# Patient Record
Sex: Male | Born: 1950 | Race: White | Hispanic: No | Marital: Single | Smoking: Never smoker
Health system: Southern US, Community
[De-identification: ages and names within clinical notes are randomized; demographics above are authoritative.]

## PROBLEM LIST (undated history)

## (undated) DIAGNOSIS — M1990 Unspecified osteoarthritis, unspecified site: Secondary | ICD-10-CM

## (undated) DIAGNOSIS — R600 Localized edema: Secondary | ICD-10-CM

## (undated) DIAGNOSIS — C61 Malignant neoplasm of prostate: Secondary | ICD-10-CM

## (undated) DIAGNOSIS — K5790 Diverticulosis of intestine, part unspecified, without perforation or abscess without bleeding: Secondary | ICD-10-CM

## (undated) DIAGNOSIS — E7800 Pure hypercholesterolemia, unspecified: Secondary | ICD-10-CM

## (undated) DIAGNOSIS — H269 Unspecified cataract: Secondary | ICD-10-CM

## (undated) DIAGNOSIS — I10 Essential (primary) hypertension: Secondary | ICD-10-CM

## (undated) DIAGNOSIS — R0600 Dyspnea, unspecified: Secondary | ICD-10-CM

## (undated) HISTORY — DX: Pure hypercholesterolemia, unspecified: E78.00

## (undated) HISTORY — DX: Diverticulosis of intestine, part unspecified, without perforation or abscess without bleeding: K57.90

## (undated) HISTORY — DX: Gilbert syndrome: E80.4

## (undated) HISTORY — PX: SHOULDER SURGERY: SHX246

## (undated) HISTORY — PX: PROSTATE BIOPSY: SHX241

---
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
## 2012-04-24 HISTORY — PX: TRANSURETHRAL RESECTION OF PROSTATE: SHX73

## 2013-04-24 HISTORY — PX: CATARACT EXTRACTION, BILATERAL: SHX1313

## 2013-05-20 DIAGNOSIS — K625 Hemorrhage of anus and rectum: Secondary | ICD-10-CM | POA: Diagnosis not present

## 2013-05-21 DIAGNOSIS — D126 Benign neoplasm of colon, unspecified: Secondary | ICD-10-CM | POA: Diagnosis not present

## 2013-05-21 DIAGNOSIS — Z7982 Long term (current) use of aspirin: Secondary | ICD-10-CM | POA: Diagnosis not present

## 2013-05-21 DIAGNOSIS — K625 Hemorrhage of anus and rectum: Secondary | ICD-10-CM | POA: Diagnosis not present

## 2013-05-21 DIAGNOSIS — I10 Essential (primary) hypertension: Secondary | ICD-10-CM | POA: Diagnosis not present

## 2013-05-21 DIAGNOSIS — K5730 Diverticulosis of large intestine without perforation or abscess without bleeding: Secondary | ICD-10-CM | POA: Diagnosis not present

## 2013-05-21 DIAGNOSIS — Z79899 Other long term (current) drug therapy: Secondary | ICD-10-CM | POA: Diagnosis not present

## 2013-05-21 DIAGNOSIS — K648 Other hemorrhoids: Secondary | ICD-10-CM | POA: Diagnosis not present

## 2013-05-21 DIAGNOSIS — E7800 Pure hypercholesterolemia, unspecified: Secondary | ICD-10-CM | POA: Diagnosis not present

## 2013-05-21 HISTORY — PX: COLONOSCOPY: SHX174

## 2013-06-02 DIAGNOSIS — C44319 Basal cell carcinoma of skin of other parts of face: Secondary | ICD-10-CM | POA: Diagnosis not present

## 2013-06-02 DIAGNOSIS — L719 Rosacea, unspecified: Secondary | ICD-10-CM | POA: Diagnosis not present

## 2013-06-02 DIAGNOSIS — D1739 Benign lipomatous neoplasm of skin and subcutaneous tissue of other sites: Secondary | ICD-10-CM | POA: Diagnosis not present

## 2013-06-02 DIAGNOSIS — D2370 Other benign neoplasm of skin of unspecified lower limb, including hip: Secondary | ICD-10-CM | POA: Diagnosis not present

## 2013-06-02 DIAGNOSIS — L820 Inflamed seborrheic keratosis: Secondary | ICD-10-CM | POA: Diagnosis not present

## 2013-07-07 DIAGNOSIS — IMO0002 Reserved for concepts with insufficient information to code with codable children: Secondary | ICD-10-CM | POA: Diagnosis not present

## 2013-07-11 DIAGNOSIS — M5137 Other intervertebral disc degeneration, lumbosacral region: Secondary | ICD-10-CM | POA: Diagnosis not present

## 2013-07-11 DIAGNOSIS — M48061 Spinal stenosis, lumbar region without neurogenic claudication: Secondary | ICD-10-CM | POA: Diagnosis not present

## 2013-07-11 DIAGNOSIS — M47817 Spondylosis without myelopathy or radiculopathy, lumbosacral region: Secondary | ICD-10-CM | POA: Diagnosis not present

## 2013-07-14 DIAGNOSIS — H25019 Cortical age-related cataract, unspecified eye: Secondary | ICD-10-CM | POA: Diagnosis not present

## 2013-07-14 DIAGNOSIS — H2510 Age-related nuclear cataract, unspecified eye: Secondary | ICD-10-CM | POA: Diagnosis not present

## 2013-07-14 DIAGNOSIS — H25049 Posterior subcapsular polar age-related cataract, unspecified eye: Secondary | ICD-10-CM | POA: Diagnosis not present

## 2013-07-14 DIAGNOSIS — H18419 Arcus senilis, unspecified eye: Secondary | ICD-10-CM | POA: Diagnosis not present

## 2013-07-16 DIAGNOSIS — IMO0002 Reserved for concepts with insufficient information to code with codable children: Secondary | ICD-10-CM | POA: Diagnosis not present

## 2013-07-24 DIAGNOSIS — M5137 Other intervertebral disc degeneration, lumbosacral region: Secondary | ICD-10-CM | POA: Diagnosis not present

## 2013-07-29 DIAGNOSIS — IMO0002 Reserved for concepts with insufficient information to code with codable children: Secondary | ICD-10-CM | POA: Diagnosis not present

## 2013-07-29 DIAGNOSIS — G541 Lumbosacral plexus disorders: Secondary | ICD-10-CM | POA: Diagnosis not present

## 2013-07-31 DIAGNOSIS — G541 Lumbosacral plexus disorders: Secondary | ICD-10-CM | POA: Diagnosis not present

## 2013-07-31 DIAGNOSIS — M1610 Unilateral primary osteoarthritis, unspecified hip: Secondary | ICD-10-CM | POA: Diagnosis not present

## 2013-07-31 DIAGNOSIS — M169 Osteoarthritis of hip, unspecified: Secondary | ICD-10-CM | POA: Diagnosis not present

## 2013-07-31 DIAGNOSIS — IMO0002 Reserved for concepts with insufficient information to code with codable children: Secondary | ICD-10-CM | POA: Diagnosis not present

## 2013-08-07 DIAGNOSIS — G541 Lumbosacral plexus disorders: Secondary | ICD-10-CM | POA: Diagnosis not present

## 2013-08-07 DIAGNOSIS — IMO0002 Reserved for concepts with insufficient information to code with codable children: Secondary | ICD-10-CM | POA: Diagnosis not present

## 2013-08-12 DIAGNOSIS — H269 Unspecified cataract: Secondary | ICD-10-CM | POA: Diagnosis not present

## 2013-08-12 DIAGNOSIS — H2510 Age-related nuclear cataract, unspecified eye: Secondary | ICD-10-CM | POA: Diagnosis not present

## 2013-08-20 DIAGNOSIS — H2510 Age-related nuclear cataract, unspecified eye: Secondary | ICD-10-CM | POA: Diagnosis not present

## 2013-08-20 DIAGNOSIS — H269 Unspecified cataract: Secondary | ICD-10-CM | POA: Diagnosis not present

## 2013-09-01 DIAGNOSIS — L719 Rosacea, unspecified: Secondary | ICD-10-CM | POA: Diagnosis not present

## 2013-09-05 DIAGNOSIS — R5381 Other malaise: Secondary | ICD-10-CM | POA: Diagnosis not present

## 2013-09-05 DIAGNOSIS — R5383 Other fatigue: Secondary | ICD-10-CM | POA: Diagnosis not present

## 2013-10-08 DIAGNOSIS — R29898 Other symptoms and signs involving the musculoskeletal system: Secondary | ICD-10-CM | POA: Diagnosis not present

## 2013-10-27 DIAGNOSIS — I10 Essential (primary) hypertension: Secondary | ICD-10-CM | POA: Diagnosis not present

## 2013-11-03 DIAGNOSIS — E782 Mixed hyperlipidemia: Secondary | ICD-10-CM | POA: Diagnosis not present

## 2013-11-04 DIAGNOSIS — H43819 Vitreous degeneration, unspecified eye: Secondary | ICD-10-CM | POA: Diagnosis not present

## 2013-12-15 DIAGNOSIS — H04129 Dry eye syndrome of unspecified lacrimal gland: Secondary | ICD-10-CM | POA: Diagnosis not present

## 2013-12-15 DIAGNOSIS — H43819 Vitreous degeneration, unspecified eye: Secondary | ICD-10-CM | POA: Diagnosis not present

## 2014-01-28 DIAGNOSIS — Z23 Encounter for immunization: Secondary | ICD-10-CM | POA: Diagnosis not present

## 2014-03-05 DIAGNOSIS — M79673 Pain in unspecified foot: Secondary | ICD-10-CM | POA: Diagnosis not present

## 2014-03-05 DIAGNOSIS — M19072 Primary osteoarthritis, left ankle and foot: Secondary | ICD-10-CM | POA: Diagnosis not present

## 2014-03-05 DIAGNOSIS — M79672 Pain in left foot: Secondary | ICD-10-CM | POA: Diagnosis not present

## 2014-03-25 DIAGNOSIS — H04123 Dry eye syndrome of bilateral lacrimal glands: Secondary | ICD-10-CM | POA: Diagnosis not present

## 2014-06-11 DIAGNOSIS — Z0000 Encounter for general adult medical examination without abnormal findings: Secondary | ICD-10-CM | POA: Diagnosis not present

## 2014-06-11 DIAGNOSIS — Z1389 Encounter for screening for other disorder: Secondary | ICD-10-CM | POA: Diagnosis not present

## 2014-06-11 DIAGNOSIS — Z79899 Other long term (current) drug therapy: Secondary | ICD-10-CM | POA: Diagnosis not present

## 2014-06-11 DIAGNOSIS — Z125 Encounter for screening for malignant neoplasm of prostate: Secondary | ICD-10-CM | POA: Diagnosis not present

## 2014-06-11 DIAGNOSIS — Z9181 History of falling: Secondary | ICD-10-CM | POA: Diagnosis not present

## 2014-06-11 DIAGNOSIS — Z6839 Body mass index (BMI) 39.0-39.9, adult: Secondary | ICD-10-CM | POA: Diagnosis not present

## 2014-10-07 DIAGNOSIS — H04123 Dry eye syndrome of bilateral lacrimal glands: Secondary | ICD-10-CM | POA: Diagnosis not present

## 2015-02-19 DIAGNOSIS — Z23 Encounter for immunization: Secondary | ICD-10-CM | POA: Diagnosis not present

## 2015-04-11 DIAGNOSIS — H1033 Unspecified acute conjunctivitis, bilateral: Secondary | ICD-10-CM | POA: Diagnosis not present

## 2015-04-25 HISTORY — PX: KNEE ARTHROSCOPY: SUR90

## 2015-05-18 DIAGNOSIS — E669 Obesity, unspecified: Secondary | ICD-10-CM | POA: Diagnosis not present

## 2015-05-18 DIAGNOSIS — Z6838 Body mass index (BMI) 38.0-38.9, adult: Secondary | ICD-10-CM | POA: Diagnosis not present

## 2015-05-18 DIAGNOSIS — I10 Essential (primary) hypertension: Secondary | ICD-10-CM | POA: Diagnosis not present

## 2015-05-18 DIAGNOSIS — K219 Gastro-esophageal reflux disease without esophagitis: Secondary | ICD-10-CM | POA: Diagnosis not present

## 2015-07-12 DIAGNOSIS — H4302 Vitreous prolapse, left eye: Secondary | ICD-10-CM | POA: Diagnosis not present

## 2015-08-27 DIAGNOSIS — M1712 Unilateral primary osteoarthritis, left knee: Secondary | ICD-10-CM | POA: Diagnosis not present

## 2015-08-27 DIAGNOSIS — M25562 Pain in left knee: Secondary | ICD-10-CM | POA: Diagnosis not present

## 2015-09-15 DIAGNOSIS — Z6838 Body mass index (BMI) 38.0-38.9, adult: Secondary | ICD-10-CM | POA: Diagnosis not present

## 2015-09-15 DIAGNOSIS — Z79899 Other long term (current) drug therapy: Secondary | ICD-10-CM | POA: Diagnosis not present

## 2015-09-15 DIAGNOSIS — Z125 Encounter for screening for malignant neoplasm of prostate: Secondary | ICD-10-CM | POA: Diagnosis not present

## 2015-09-15 DIAGNOSIS — I10 Essential (primary) hypertension: Secondary | ICD-10-CM | POA: Diagnosis not present

## 2015-09-15 DIAGNOSIS — N400 Enlarged prostate without lower urinary tract symptoms: Secondary | ICD-10-CM | POA: Diagnosis not present

## 2015-09-15 DIAGNOSIS — K219 Gastro-esophageal reflux disease without esophagitis: Secondary | ICD-10-CM | POA: Diagnosis not present

## 2015-09-24 DIAGNOSIS — M1712 Unilateral primary osteoarthritis, left knee: Secondary | ICD-10-CM | POA: Diagnosis not present

## 2015-11-18 DIAGNOSIS — H26491 Other secondary cataract, right eye: Secondary | ICD-10-CM | POA: Diagnosis not present

## 2015-11-18 DIAGNOSIS — H04123 Dry eye syndrome of bilateral lacrimal glands: Secondary | ICD-10-CM | POA: Diagnosis not present

## 2015-11-18 DIAGNOSIS — H43812 Vitreous degeneration, left eye: Secondary | ICD-10-CM | POA: Diagnosis not present

## 2015-12-09 DIAGNOSIS — H26492 Other secondary cataract, left eye: Secondary | ICD-10-CM | POA: Diagnosis not present

## 2015-12-09 DIAGNOSIS — Z961 Presence of intraocular lens: Secondary | ICD-10-CM | POA: Diagnosis not present

## 2015-12-09 DIAGNOSIS — I10 Essential (primary) hypertension: Secondary | ICD-10-CM | POA: Diagnosis not present

## 2015-12-09 DIAGNOSIS — H26491 Other secondary cataract, right eye: Secondary | ICD-10-CM | POA: Diagnosis not present

## 2015-12-17 DIAGNOSIS — M1712 Unilateral primary osteoarthritis, left knee: Secondary | ICD-10-CM | POA: Diagnosis not present

## 2015-12-22 DIAGNOSIS — X58XXXA Exposure to other specified factors, initial encounter: Secondary | ICD-10-CM | POA: Diagnosis not present

## 2015-12-22 DIAGNOSIS — M25562 Pain in left knee: Secondary | ICD-10-CM | POA: Diagnosis not present

## 2015-12-22 DIAGNOSIS — M1712 Unilateral primary osteoarthritis, left knee: Secondary | ICD-10-CM | POA: Diagnosis not present

## 2015-12-22 DIAGNOSIS — S83242A Other tear of medial meniscus, current injury, left knee, initial encounter: Secondary | ICD-10-CM | POA: Diagnosis not present

## 2015-12-24 DIAGNOSIS — M1712 Unilateral primary osteoarthritis, left knee: Secondary | ICD-10-CM | POA: Diagnosis not present

## 2015-12-24 DIAGNOSIS — S83242A Other tear of medial meniscus, current injury, left knee, initial encounter: Secondary | ICD-10-CM | POA: Diagnosis not present

## 2016-01-18 DIAGNOSIS — Z79899 Other long term (current) drug therapy: Secondary | ICD-10-CM | POA: Diagnosis not present

## 2016-01-18 DIAGNOSIS — Z9181 History of falling: Secondary | ICD-10-CM | POA: Diagnosis not present

## 2016-01-18 DIAGNOSIS — K219 Gastro-esophageal reflux disease without esophagitis: Secondary | ICD-10-CM | POA: Diagnosis not present

## 2016-01-18 DIAGNOSIS — N400 Enlarged prostate without lower urinary tract symptoms: Secondary | ICD-10-CM | POA: Diagnosis not present

## 2016-01-18 DIAGNOSIS — I10 Essential (primary) hypertension: Secondary | ICD-10-CM | POA: Diagnosis not present

## 2016-01-18 DIAGNOSIS — Z6838 Body mass index (BMI) 38.0-38.9, adult: Secondary | ICD-10-CM | POA: Diagnosis not present

## 2016-01-18 DIAGNOSIS — Z23 Encounter for immunization: Secondary | ICD-10-CM | POA: Diagnosis not present

## 2016-02-18 DIAGNOSIS — S83242D Other tear of medial meniscus, current injury, left knee, subsequent encounter: Secondary | ICD-10-CM | POA: Diagnosis not present

## 2016-02-18 DIAGNOSIS — M1712 Unilateral primary osteoarthritis, left knee: Secondary | ICD-10-CM | POA: Diagnosis not present

## 2016-04-03 DIAGNOSIS — M25562 Pain in left knee: Secondary | ICD-10-CM | POA: Diagnosis not present

## 2016-04-03 DIAGNOSIS — S83242A Other tear of medial meniscus, current injury, left knee, initial encounter: Secondary | ICD-10-CM | POA: Diagnosis not present

## 2016-04-03 DIAGNOSIS — M1712 Unilateral primary osteoarthritis, left knee: Secondary | ICD-10-CM | POA: Diagnosis not present

## 2016-04-06 DIAGNOSIS — M659 Synovitis and tenosynovitis, unspecified: Secondary | ICD-10-CM | POA: Diagnosis not present

## 2016-04-06 DIAGNOSIS — E669 Obesity, unspecified: Secondary | ICD-10-CM | POA: Diagnosis not present

## 2016-04-06 DIAGNOSIS — I10 Essential (primary) hypertension: Secondary | ICD-10-CM | POA: Diagnosis not present

## 2016-04-06 DIAGNOSIS — M23222 Derangement of posterior horn of medial meniscus due to old tear or injury, left knee: Secondary | ICD-10-CM | POA: Diagnosis not present

## 2016-04-06 DIAGNOSIS — Z79899 Other long term (current) drug therapy: Secondary | ICD-10-CM | POA: Diagnosis not present

## 2016-04-06 DIAGNOSIS — Z79891 Long term (current) use of opiate analgesic: Secondary | ICD-10-CM | POA: Diagnosis not present

## 2016-04-06 DIAGNOSIS — M2242 Chondromalacia patellae, left knee: Secondary | ICD-10-CM | POA: Diagnosis not present

## 2016-04-06 DIAGNOSIS — Z7982 Long term (current) use of aspirin: Secondary | ICD-10-CM | POA: Diagnosis not present

## 2016-04-06 DIAGNOSIS — S83242A Other tear of medial meniscus, current injury, left knee, initial encounter: Secondary | ICD-10-CM | POA: Diagnosis not present

## 2016-04-06 DIAGNOSIS — M94262 Chondromalacia, left knee: Secondary | ICD-10-CM | POA: Diagnosis not present

## 2016-04-06 DIAGNOSIS — M1712 Unilateral primary osteoarthritis, left knee: Secondary | ICD-10-CM | POA: Diagnosis not present

## 2016-04-06 DIAGNOSIS — N400 Enlarged prostate without lower urinary tract symptoms: Secondary | ICD-10-CM | POA: Diagnosis not present

## 2016-04-06 DIAGNOSIS — M6752 Plica syndrome, left knee: Secondary | ICD-10-CM | POA: Diagnosis not present

## 2016-04-10 DIAGNOSIS — M25562 Pain in left knee: Secondary | ICD-10-CM | POA: Diagnosis not present

## 2016-04-10 DIAGNOSIS — R2689 Other abnormalities of gait and mobility: Secondary | ICD-10-CM | POA: Diagnosis not present

## 2016-04-10 DIAGNOSIS — M25662 Stiffness of left knee, not elsewhere classified: Secondary | ICD-10-CM | POA: Diagnosis not present

## 2016-04-14 DIAGNOSIS — M25662 Stiffness of left knee, not elsewhere classified: Secondary | ICD-10-CM | POA: Diagnosis not present

## 2016-04-14 DIAGNOSIS — R2689 Other abnormalities of gait and mobility: Secondary | ICD-10-CM | POA: Diagnosis not present

## 2016-04-14 DIAGNOSIS — M25562 Pain in left knee: Secondary | ICD-10-CM | POA: Diagnosis not present

## 2016-04-19 DIAGNOSIS — M25562 Pain in left knee: Secondary | ICD-10-CM | POA: Diagnosis not present

## 2016-04-19 DIAGNOSIS — R2689 Other abnormalities of gait and mobility: Secondary | ICD-10-CM | POA: Diagnosis not present

## 2016-04-19 DIAGNOSIS — M25662 Stiffness of left knee, not elsewhere classified: Secondary | ICD-10-CM | POA: Diagnosis not present

## 2016-04-26 DIAGNOSIS — M25562 Pain in left knee: Secondary | ICD-10-CM | POA: Diagnosis not present

## 2016-04-26 DIAGNOSIS — M25662 Stiffness of left knee, not elsewhere classified: Secondary | ICD-10-CM | POA: Diagnosis not present

## 2016-04-26 DIAGNOSIS — R2689 Other abnormalities of gait and mobility: Secondary | ICD-10-CM | POA: Diagnosis not present

## 2016-04-28 DIAGNOSIS — M25662 Stiffness of left knee, not elsewhere classified: Secondary | ICD-10-CM | POA: Diagnosis not present

## 2016-04-28 DIAGNOSIS — R2689 Other abnormalities of gait and mobility: Secondary | ICD-10-CM | POA: Diagnosis not present

## 2016-04-28 DIAGNOSIS — M25562 Pain in left knee: Secondary | ICD-10-CM | POA: Diagnosis not present

## 2016-05-01 DIAGNOSIS — M25662 Stiffness of left knee, not elsewhere classified: Secondary | ICD-10-CM | POA: Diagnosis not present

## 2016-05-01 DIAGNOSIS — M25562 Pain in left knee: Secondary | ICD-10-CM | POA: Diagnosis not present

## 2016-05-01 DIAGNOSIS — R2689 Other abnormalities of gait and mobility: Secondary | ICD-10-CM | POA: Diagnosis not present

## 2016-05-22 DIAGNOSIS — I10 Essential (primary) hypertension: Secondary | ICD-10-CM | POA: Diagnosis not present

## 2016-05-22 DIAGNOSIS — N400 Enlarged prostate without lower urinary tract symptoms: Secondary | ICD-10-CM | POA: Diagnosis not present

## 2016-05-22 DIAGNOSIS — R609 Edema, unspecified: Secondary | ICD-10-CM | POA: Diagnosis not present

## 2016-05-22 DIAGNOSIS — K219 Gastro-esophageal reflux disease without esophagitis: Secondary | ICD-10-CM | POA: Diagnosis not present

## 2016-05-22 DIAGNOSIS — J0100 Acute maxillary sinusitis, unspecified: Secondary | ICD-10-CM | POA: Diagnosis not present

## 2016-05-22 DIAGNOSIS — Z6841 Body Mass Index (BMI) 40.0 and over, adult: Secondary | ICD-10-CM | POA: Diagnosis not present

## 2016-05-22 DIAGNOSIS — Z79899 Other long term (current) drug therapy: Secondary | ICD-10-CM | POA: Diagnosis not present

## 2016-05-22 DIAGNOSIS — Z9181 History of falling: Secondary | ICD-10-CM | POA: Diagnosis not present

## 2016-05-22 DIAGNOSIS — Z1389 Encounter for screening for other disorder: Secondary | ICD-10-CM | POA: Diagnosis not present

## 2016-05-26 DIAGNOSIS — Z136 Encounter for screening for cardiovascular disorders: Secondary | ICD-10-CM | POA: Diagnosis not present

## 2016-05-26 DIAGNOSIS — Z9181 History of falling: Secondary | ICD-10-CM | POA: Diagnosis not present

## 2016-05-26 DIAGNOSIS — Z125 Encounter for screening for malignant neoplasm of prostate: Secondary | ICD-10-CM | POA: Diagnosis not present

## 2016-05-26 DIAGNOSIS — Z0000 Encounter for general adult medical examination without abnormal findings: Secondary | ICD-10-CM | POA: Diagnosis not present

## 2016-05-26 DIAGNOSIS — E669 Obesity, unspecified: Secondary | ICD-10-CM | POA: Diagnosis not present

## 2016-05-26 DIAGNOSIS — Z1389 Encounter for screening for other disorder: Secondary | ICD-10-CM | POA: Diagnosis not present

## 2016-05-29 DIAGNOSIS — M1712 Unilateral primary osteoarthritis, left knee: Secondary | ICD-10-CM | POA: Diagnosis not present

## 2016-06-09 DIAGNOSIS — N401 Enlarged prostate with lower urinary tract symptoms: Secondary | ICD-10-CM | POA: Diagnosis not present

## 2016-06-09 DIAGNOSIS — R351 Nocturia: Secondary | ICD-10-CM | POA: Diagnosis not present

## 2016-06-16 DIAGNOSIS — M1712 Unilateral primary osteoarthritis, left knee: Secondary | ICD-10-CM | POA: Diagnosis not present

## 2016-07-10 DIAGNOSIS — N401 Enlarged prostate with lower urinary tract symptoms: Secondary | ICD-10-CM | POA: Diagnosis not present

## 2016-07-10 DIAGNOSIS — N529 Male erectile dysfunction, unspecified: Secondary | ICD-10-CM | POA: Diagnosis not present

## 2016-07-10 DIAGNOSIS — R351 Nocturia: Secondary | ICD-10-CM | POA: Diagnosis not present

## 2016-07-10 DIAGNOSIS — K5900 Constipation, unspecified: Secondary | ICD-10-CM | POA: Diagnosis not present

## 2016-08-28 DIAGNOSIS — M1712 Unilateral primary osteoarthritis, left knee: Secondary | ICD-10-CM | POA: Diagnosis not present

## 2016-09-19 DIAGNOSIS — N400 Enlarged prostate without lower urinary tract symptoms: Secondary | ICD-10-CM | POA: Diagnosis not present

## 2016-09-19 DIAGNOSIS — K219 Gastro-esophageal reflux disease without esophagitis: Secondary | ICD-10-CM | POA: Diagnosis not present

## 2016-09-19 DIAGNOSIS — R609 Edema, unspecified: Secondary | ICD-10-CM | POA: Diagnosis not present

## 2016-09-19 DIAGNOSIS — Z79899 Other long term (current) drug therapy: Secondary | ICD-10-CM | POA: Diagnosis not present

## 2016-09-19 DIAGNOSIS — I10 Essential (primary) hypertension: Secondary | ICD-10-CM | POA: Diagnosis not present

## 2016-09-19 DIAGNOSIS — Z6837 Body mass index (BMI) 37.0-37.9, adult: Secondary | ICD-10-CM | POA: Diagnosis not present

## 2016-09-19 DIAGNOSIS — Z9181 History of falling: Secondary | ICD-10-CM | POA: Diagnosis not present

## 2016-09-20 DIAGNOSIS — N529 Male erectile dysfunction, unspecified: Secondary | ICD-10-CM | POA: Diagnosis not present

## 2016-09-20 DIAGNOSIS — Z125 Encounter for screening for malignant neoplasm of prostate: Secondary | ICD-10-CM | POA: Diagnosis not present

## 2016-09-20 DIAGNOSIS — N401 Enlarged prostate with lower urinary tract symptoms: Secondary | ICD-10-CM | POA: Diagnosis not present

## 2016-11-06 DIAGNOSIS — R972 Elevated prostate specific antigen [PSA]: Secondary | ICD-10-CM | POA: Diagnosis not present

## 2016-11-23 DIAGNOSIS — R972 Elevated prostate specific antigen [PSA]: Secondary | ICD-10-CM | POA: Diagnosis not present

## 2016-11-23 DIAGNOSIS — N401 Enlarged prostate with lower urinary tract symptoms: Secondary | ICD-10-CM | POA: Diagnosis not present

## 2016-11-23 DIAGNOSIS — D075 Carcinoma in situ of prostate: Secondary | ICD-10-CM | POA: Diagnosis not present

## 2016-11-23 DIAGNOSIS — C61 Malignant neoplasm of prostate: Secondary | ICD-10-CM | POA: Diagnosis not present

## 2016-11-30 DIAGNOSIS — C61 Malignant neoplasm of prostate: Secondary | ICD-10-CM | POA: Diagnosis not present

## 2016-12-04 DIAGNOSIS — M1712 Unilateral primary osteoarthritis, left knee: Secondary | ICD-10-CM | POA: Diagnosis not present

## 2016-12-07 DIAGNOSIS — K449 Diaphragmatic hernia without obstruction or gangrene: Secondary | ICD-10-CM | POA: Diagnosis not present

## 2016-12-07 DIAGNOSIS — K7689 Other specified diseases of liver: Secondary | ICD-10-CM | POA: Diagnosis not present

## 2016-12-07 DIAGNOSIS — C61 Malignant neoplasm of prostate: Secondary | ICD-10-CM | POA: Diagnosis not present

## 2016-12-14 DIAGNOSIS — C61 Malignant neoplasm of prostate: Secondary | ICD-10-CM | POA: Diagnosis not present

## 2016-12-20 ENCOUNTER — Encounter: Payer: Self-pay | Admitting: Radiation Oncology

## 2016-12-29 ENCOUNTER — Encounter: Payer: Self-pay | Admitting: Radiation Oncology

## 2016-12-29 NOTE — Progress Notes (Addendum)
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 Prostate volume: 21.9 cc.   Frederick Carter. presented with signs/symptoms of: Had a regular check-up and was diagnosed.  Biopsies of prostate (if applicable) revealed:    Past/Anticipated interventions by urology, if any: cystoscopy, prostate biopsy  Past/Anticipated interventions by medical oncology, if any: no  Weight changes, if any: None  Bowel/Bladder complaints, if any:  Get up aleast once at night to void.  Denies any bleeding,states that he has some burning with urination,but its rare.States that his stream is straights some time and some time its stop and go with a medium flow. States that he has had some constipation. Nausea/Vomiting, if any: no  Pain issues, if any: Denies any pain  SAFETY ISSUES:  Prior radiation? No   Pacemaker/ICD? No  Possible current pregnancy? no  Is the patient on methotrexate? No  Current Complaints / other details:  66 year old male. Divorced. Retired. Surgery vs XRT vs active surveillance. Most interested in seeds.   Vitals:   01/01/17 1045  BP: 135/84  Pulse: (!) 52  Resp: 20  Temp: 98.7 F (37.1 C)  TempSrc: Oral  SpO2: 93%  Weight: 245 lb 6 oz (111.3 kg)   Wt Readings from Last 3 Encounters:  01/01/17 245 lb 6 oz (111.3 kg)   12/07/2016  Bun 24   Creatinine 1.00  eGFr >60

## 2017-01-01 ENCOUNTER — Encounter: Payer: Self-pay | Admitting: Medical Oncology

## 2017-01-01 ENCOUNTER — Ambulatory Visit
Admission: RE | Admit: 2017-01-01 | Discharge: 2017-01-01 | Disposition: A | Discharge status: Home / Self Care | Payer: Medicare Other | Source: Ambulatory Visit | Attending: Radiation Oncology | Admitting: Radiation Oncology

## 2017-01-01 ENCOUNTER — Encounter: Payer: Self-pay | Admitting: Radiation Oncology

## 2017-01-01 VITALS — BP 135/84 | HR 52 | Temp 98.7°F | Resp 20 | Wt 245.4 lb

## 2017-01-01 DIAGNOSIS — Z8042 Family history of malignant neoplasm of prostate: Secondary | ICD-10-CM | POA: Diagnosis not present

## 2017-01-01 DIAGNOSIS — R972 Elevated prostate specific antigen [PSA]: Secondary | ICD-10-CM | POA: Diagnosis not present

## 2017-01-01 DIAGNOSIS — C61 Malignant neoplasm of prostate: Secondary | ICD-10-CM

## 2017-01-01 DIAGNOSIS — Z809 Family history of malignant neoplasm, unspecified: Secondary | ICD-10-CM

## 2017-01-01 DIAGNOSIS — I10 Essential (primary) hypertension: Secondary | ICD-10-CM | POA: Diagnosis not present

## 2017-01-01 HISTORY — DX: Malignant neoplasm of prostate: C61

## 2017-01-01 HISTORY — DX: Essential (primary) hypertension: I10

## 2017-01-01 NOTE — Progress Notes (Signed)
Radiation Oncology         [phone]) [phone] ________________________________  Initial outpatient Consultation  Name: Frederick Carter. MRN: [id number]  Date: 01/01/2017  DOB: 1951/02/04  EP:PIRJJO, Pcp Not In  Joie Bimler, MD   REFERRING PHYSICIAN: Joie Bimler, MD  DIAGNOSIS: 66 year-old gentleman with Stage T1c adenocarcinoma of the prostate with Gleason Score of 3+3, and PSA of 4.2.   The encounter diagnosis was Malignant neoplasm of prostate ([place]).    ICD-10-CM   1. Malignant neoplasm of prostate ([place]) C61     HISTORY OF PRESENT ILLNESS: Frederick Mandato. is a 66 y.o. male with a diagnosis of prostate cancer. He was noted to have a rising PSA over the past 3 years with a doubling in the past two years to an elevated PSA of 4.2 on 11/06/16 by his primary care physician, Dr. Megan Salon.  Accordingly, he was referred for evaluation in urology by Dr. Nila Nephew on 11/23/16,  digital rectal examination was performed at that time revealing no palpable abnormality.  The patient proceeded to transrectal ultrasound with 10 biopsies of the prostate on 11/23/16.  The prostate volume measured 41 cc.  Out of 12 core biopsies, 2 were positive.  The maximum Gleason score was 3+3, and this was seen in both the left lateral apex, and right lateral base. Of note, the patient had TURP about 5-6 years ago. This was recommended to alleviate slow urinary stream symptoms. The procedure did not improve his slow stream.   The patient reviewed the biopsy results with his urologist and he has kindly been referred today for discussion of potential radiation treatment options.  PREVIOUS RADIATION THERAPY: No  PAST MEDICAL HISTORY:  Past Medical History:  Diagnosis Date  . Hypertension   . Prostate cancer ([place])       PAST SURGICAL HISTORY: Past Surgical History:  Procedure Laterality Date  . PROSTATE BIOPSY      FAMILY HISTORY:  Family History  Problem Relation Age of Onset  . Arthritis Mother   . Cancer  Brother        Prostate  . Cancer Sister        Brain tumor  . Cancer Paternal Uncle 79       Prostate  . Cancer Brother        Lung and Brain    SOCIAL HISTORY:  Social History   Social History  . Marital status: Single    Spouse name: N/A  . Number of children: N/A  . Years of education: N/A   Occupational History  . Not on file.   Social History Main Topics  . Smoking status: Never Smoker  . Smokeless tobacco: Never Used  . Alcohol use Yes     Comment: occasional  . Drug use: No  . Sexual activity: Not on file   Other Topics Concern  . Not on file   Social History Narrative  . No narrative on file  The patient is single but in a relationship. His girlfriend accompanies him.   ALLERGIES: Patient has no known allergies.  MEDICATIONS:  Current Outpatient Prescriptions  Medication Sig Dispense Refill  . aspirin EC 81 MG tablet Take 81 mg by mouth daily.    . carvedilol (COREG) 12.5 MG tablet Take 12.5 mg by mouth 2 (two) times daily with a meal.    . furosemide (LASIX) 40 MG tablet Take 40 mg by mouth daily.    Marland Kitchen lisinopril-hydrochlorothiazide (PRINZIDE,ZESTORETIC) 20-25 MG tablet Take 1 tablet by mouth  daily.    . multivitamin-lutein (OCUVITE-LUTEIN) CAPS capsule Take 1 capsule by mouth daily.    . tamsulosin (FLOMAX) 0.4 MG CAPS capsule Take 0.4 mg by mouth.     No current facility-administered medications for this encounter.     REVIEW OF SYSTEMS:  On review of systems, the patient reports that he is doing well overall. He denies any chest pain, shortness of breath, cough, fevers, chills, night sweats, unintended weight changes. He denies any abdominal pain, nausea or vomiting. He reports some constipation. He denies any new musculoskeletal or joint aches or pains. His IPSS was 13, indicating moderate urinary symptoms. He reports nocturia x 1 and rare dysuria. He reports intermittency regarding his urinary flow. He denies hematuria. He is able to complete sexual  activity with most attempts. A complete review of systems is obtained and is otherwise negative.    PHYSICAL EXAM:  Wt Readings from Last 3 Encounters:  01/01/17 245 lb 6 oz (111.3 kg)   Temp Readings from Last 3 Encounters:  01/01/17 98.7 F (37.1 C) (Oral)   BP Readings from Last 3 Encounters:  01/01/17 135/84   Pulse Readings from Last 3 Encounters:  01/01/17 (!) 52   Pain Assessment Pain Score: 0-No pain/10  In general this is a well appearing caucasian male in no acute distress. He is alert and oriented x4 and appropriate throughout the examination. HEENT reveals that the patient is normocephalic, atraumatic. EOMs are intact. PERRLA. Skin is intact without any evidence of gross lesions. Cardiovascular exam reveals a regular rate and rhythm, no clicks rubs or murmurs are auscultated. Chest is clear to auscultation bilaterally. Lymphatic assessment is performed and does not reveal any adenopathy in the cervical, supraclavicular, axillary, or inguinal chains. Abdomen has active bowel sounds in all quadrants and is intact. The abdomen is soft, non tender, non distended. Lower extremities are negative for pretibial pitting edema, deep calf tenderness, cyanosis or clubbing.   KPS = 100  100 - Normal; no complaints; no evidence of disease. 90   - Able to carry on normal activity; minor signs or symptoms of disease. 80   - Normal activity with effort; some signs or symptoms of disease. 47   - Cares for self; unable to carry on normal activity or to do active work. 60   - Requires occasional assistance, but is able to care for most of his personal needs. 50   - Requires considerable assistance and frequent medical care. 98   - Disabled; requires special care and assistance. 1   - Severely disabled; hospital admission is indicated although death not imminent. 39   - Very sick; hospital admission necessary; active supportive treatment necessary. 10   - Moribund; fatal processes  progressing rapidly. 0     - Dead  Karnofsky DA, Abelmann WH, Craver LS and Burchenal JH [phone]) The use of the nitrogen mustards in the palliative treatment of carcinoma: with particular reference to bronchogenic carcinoma Cancer 1 [phone]  LABORATORY DATA:  No results found for: WBC, HGB, HCT, MCV, PLT No results found for: NA, K, CL, CO2 No results found for: ALT, AST, GGT, ALKPHOS, BILITOT   RADIOGRAPHY: No results found.    IMPRESSION/PLAN: 1. 66 y.o. gentleman with Stage T1c adenocarcinoma of the prostate with Gleason Score of 3+3, and PSA of 4.2.  We reviewed the findings and workup thus far.  We discussed the natural history of prostate cancer.  We reviewed the the implications of T-stage, Gleason's Score, and PSA  on decision-making and outcomes in prostate cancer.  We discussed radiation treatment in the management of prostate cancer with regard to the logistics and delivery of external beam radiation treatment as well as the logistics and delivery of prostate brachytherapy. Given his previous TURP, unfortunately he is not an ideal candidate for radioactive seed implant brachytherapy.  We also discussed the option of active surveillance considering his low Gleason score and PSA. The patient would like to proceed with active surveillance at this time.  We will share our findings with Dr. Nila Nephew and the patient will continue to follow closely with Dr. Nila Nephew for repeat prostate biopsies and regular PSA checks. He will be referred back to our clinic as needed.  2.  Possible genetic predisposition to malignancy. Based on the patient's family history of prostate cancer he will be offered a referral to genetics.     Carola Rhine, PAC    Tyler Pita, MD  [facility] Direct Dial: [phone]  Fax: [phone] [email or web address]  Skype  [email or web address]    This document serves as a record of services personally performed by Tyler Pita, MD and Shona Simpson, PA-C.  It was created on their behalf by Arlyce Harman, a trained medical scribe. The creation of this record is based on the scribe's personal observations and the provider's statements to them. This document has been checked and approved by the attending provider.

## 2017-01-02 ENCOUNTER — Institutional Professional Consult (permissible substitution): Payer: Self-pay | Admitting: Radiation Oncology

## 2017-01-02 ENCOUNTER — Telehealth: Payer: Self-pay | Admitting: *Deleted

## 2017-01-02 NOTE — Progress Notes (Signed)
Introduced myself to Mr. Hinchliffe and his significant other as the prostate nurse navigator and my role. He is here today to discuss his treatment options for prostate cancer. I will follow and asked him to call me with questions or concerns. He voiced understanding.

## 2017-01-02 NOTE — Telephone Encounter (Signed)
Called patient to inform of genetics appt. For 01-15-17 @ 2 pm with Roma Kayser, spoke with patient and he is aware of this appt.

## 2017-01-03 DIAGNOSIS — M1712 Unilateral primary osteoarthritis, left knee: Secondary | ICD-10-CM | POA: Diagnosis not present

## 2017-01-11 ENCOUNTER — Encounter: Payer: Self-pay | Admitting: Genetic Counselor

## 2017-01-11 DIAGNOSIS — Z1379 Encounter for other screening for genetic and chromosomal anomalies: Secondary | ICD-10-CM

## 2017-01-11 HISTORY — DX: Encounter for other screening for genetic and chromosomal anomalies: Z13.79

## 2017-01-15 ENCOUNTER — Other Ambulatory Visit: Payer: Medicare Other

## 2017-01-15 ENCOUNTER — Encounter: Payer: Medicare Other | Admitting: Genetic Counselor

## 2017-01-15 DIAGNOSIS — C61 Malignant neoplasm of prostate: Secondary | ICD-10-CM | POA: Diagnosis not present

## 2017-01-15 DIAGNOSIS — R351 Nocturia: Secondary | ICD-10-CM | POA: Diagnosis not present

## 2017-01-22 DIAGNOSIS — Z6838 Body mass index (BMI) 38.0-38.9, adult: Secondary | ICD-10-CM | POA: Diagnosis not present

## 2017-01-22 DIAGNOSIS — N400 Enlarged prostate without lower urinary tract symptoms: Secondary | ICD-10-CM | POA: Diagnosis not present

## 2017-01-22 DIAGNOSIS — R609 Edema, unspecified: Secondary | ICD-10-CM | POA: Diagnosis not present

## 2017-01-22 DIAGNOSIS — K219 Gastro-esophageal reflux disease without esophagitis: Secondary | ICD-10-CM | POA: Diagnosis not present

## 2017-01-22 DIAGNOSIS — I10 Essential (primary) hypertension: Secondary | ICD-10-CM | POA: Diagnosis not present

## 2017-01-22 DIAGNOSIS — Z79899 Other long term (current) drug therapy: Secondary | ICD-10-CM | POA: Diagnosis not present

## 2017-01-23 DIAGNOSIS — H43812 Vitreous degeneration, left eye: Secondary | ICD-10-CM | POA: Diagnosis not present

## 2017-01-23 DIAGNOSIS — H04123 Dry eye syndrome of bilateral lacrimal glands: Secondary | ICD-10-CM | POA: Diagnosis not present

## 2017-02-22 DIAGNOSIS — Z23 Encounter for immunization: Secondary | ICD-10-CM | POA: Diagnosis not present

## 2017-04-26 DIAGNOSIS — N3289 Other specified disorders of bladder: Secondary | ICD-10-CM | POA: Diagnosis not present

## 2017-04-26 DIAGNOSIS — C61 Malignant neoplasm of prostate: Secondary | ICD-10-CM | POA: Diagnosis not present

## 2017-05-16 DIAGNOSIS — D1801 Hemangioma of skin and subcutaneous tissue: Secondary | ICD-10-CM | POA: Diagnosis not present

## 2017-05-16 DIAGNOSIS — L820 Inflamed seborrheic keratosis: Secondary | ICD-10-CM | POA: Diagnosis not present

## 2017-05-16 DIAGNOSIS — C44329 Squamous cell carcinoma of skin of other parts of face: Secondary | ICD-10-CM | POA: Diagnosis not present

## 2017-05-16 DIAGNOSIS — L918 Other hypertrophic disorders of the skin: Secondary | ICD-10-CM | POA: Diagnosis not present

## 2017-05-16 DIAGNOSIS — L821 Other seborrheic keratosis: Secondary | ICD-10-CM | POA: Diagnosis not present

## 2017-05-28 DIAGNOSIS — R635 Abnormal weight gain: Secondary | ICD-10-CM | POA: Diagnosis not present

## 2017-05-28 DIAGNOSIS — I10 Essential (primary) hypertension: Secondary | ICD-10-CM | POA: Diagnosis not present

## 2017-05-28 DIAGNOSIS — Z6838 Body mass index (BMI) 38.0-38.9, adult: Secondary | ICD-10-CM | POA: Diagnosis not present

## 2017-05-28 DIAGNOSIS — R609 Edema, unspecified: Secondary | ICD-10-CM | POA: Diagnosis not present

## 2017-05-28 DIAGNOSIS — Z79899 Other long term (current) drug therapy: Secondary | ICD-10-CM | POA: Diagnosis not present

## 2017-05-28 DIAGNOSIS — E669 Obesity, unspecified: Secondary | ICD-10-CM | POA: Diagnosis not present

## 2017-05-28 DIAGNOSIS — K219 Gastro-esophageal reflux disease without esophagitis: Secondary | ICD-10-CM | POA: Diagnosis not present

## 2017-05-28 DIAGNOSIS — N400 Enlarged prostate without lower urinary tract symptoms: Secondary | ICD-10-CM | POA: Diagnosis not present

## 2017-08-23 DIAGNOSIS — N401 Enlarged prostate with lower urinary tract symptoms: Secondary | ICD-10-CM | POA: Diagnosis not present

## 2017-08-23 DIAGNOSIS — C61 Malignant neoplasm of prostate: Secondary | ICD-10-CM | POA: Diagnosis not present

## 2017-08-24 DIAGNOSIS — M1712 Unilateral primary osteoarthritis, left knee: Secondary | ICD-10-CM | POA: Diagnosis not present

## 2017-09-25 DIAGNOSIS — R609 Edema, unspecified: Secondary | ICD-10-CM | POA: Diagnosis not present

## 2017-09-25 DIAGNOSIS — I10 Essential (primary) hypertension: Secondary | ICD-10-CM | POA: Diagnosis not present

## 2017-09-25 DIAGNOSIS — Z9181 History of falling: Secondary | ICD-10-CM | POA: Diagnosis not present

## 2017-09-25 DIAGNOSIS — Z1331 Encounter for screening for depression: Secondary | ICD-10-CM | POA: Diagnosis not present

## 2017-09-25 DIAGNOSIS — N400 Enlarged prostate without lower urinary tract symptoms: Secondary | ICD-10-CM | POA: Diagnosis not present

## 2017-09-25 DIAGNOSIS — K219 Gastro-esophageal reflux disease without esophagitis: Secondary | ICD-10-CM | POA: Diagnosis not present

## 2017-10-10 DIAGNOSIS — M1712 Unilateral primary osteoarthritis, left knee: Secondary | ICD-10-CM | POA: Diagnosis not present

## 2017-11-27 DIAGNOSIS — C61 Malignant neoplasm of prostate: Secondary | ICD-10-CM | POA: Diagnosis not present

## 2017-11-27 DIAGNOSIS — N401 Enlarged prostate with lower urinary tract symptoms: Secondary | ICD-10-CM | POA: Diagnosis not present

## 2018-01-07 DIAGNOSIS — H18422 Band keratopathy, left eye: Secondary | ICD-10-CM | POA: Diagnosis not present

## 2018-01-09 DIAGNOSIS — H04123 Dry eye syndrome of bilateral lacrimal glands: Secondary | ICD-10-CM | POA: Diagnosis not present

## 2018-01-25 DIAGNOSIS — Z23 Encounter for immunization: Secondary | ICD-10-CM | POA: Diagnosis not present

## 2018-01-30 DIAGNOSIS — I10 Essential (primary) hypertension: Secondary | ICD-10-CM | POA: Diagnosis not present

## 2018-01-30 DIAGNOSIS — R609 Edema, unspecified: Secondary | ICD-10-CM | POA: Diagnosis not present

## 2018-01-30 DIAGNOSIS — K219 Gastro-esophageal reflux disease without esophagitis: Secondary | ICD-10-CM | POA: Diagnosis not present

## 2018-01-30 DIAGNOSIS — N400 Enlarged prostate without lower urinary tract symptoms: Secondary | ICD-10-CM | POA: Diagnosis not present

## 2018-02-28 DIAGNOSIS — H04123 Dry eye syndrome of bilateral lacrimal glands: Secondary | ICD-10-CM | POA: Diagnosis not present

## 2018-02-28 DIAGNOSIS — H43812 Vitreous degeneration, left eye: Secondary | ICD-10-CM | POA: Diagnosis not present

## 2018-03-28 DIAGNOSIS — C61 Malignant neoplasm of prostate: Secondary | ICD-10-CM | POA: Diagnosis not present

## 2018-03-28 DIAGNOSIS — R351 Nocturia: Secondary | ICD-10-CM | POA: Diagnosis not present

## 2018-04-07 DIAGNOSIS — R0602 Shortness of breath: Secondary | ICD-10-CM | POA: Insufficient documentation

## 2018-04-07 HISTORY — DX: Shortness of breath: R06.02

## 2018-04-07 NOTE — Progress Notes (Signed)
Cardiology Office Note:    Date:  04/08/2018   ID:  Frederick Blew., DOB 03/18/1951, MRN [id number]  PCP:  Helen Hashimoto., MD  Cardiologist:  Shirlee More, MD   Referring MD: No ref. provider found  ASSESSMENT:    1. SOB (shortness of breath)   2. Essential hypertension    PLAN:    In order of problems listed above:  1. Frederick Carter edema is associated with hypertension shortness of breath clinically appears to have heart failure asked him to fully sodium restrict he still add salt to Frederick Carter diet take Frederick Carter diuretic daily check proBNP level echocardiogram.  If he fails to improve or require further evaluation such as a sleep study.  Also check d-dimer as a screen for venous thromboembolism but has no particular risk factors 2. Stable continue current treatment ACE thiazide diuretic beta-blocker take Frederick Carter diuretic daily 3. Morbid obesity he is failed weight loss attempts and if he has evidence of heart failure would benefit from bariatric surgery will discuss at next visit  Next appointment 2 months   Medication Adjustments/Labs and Tests Ordered: Current medicines are reviewed at length with the patient today.  Concerns regarding medicines are outlined above.  No orders of the defined types were placed in this encounter.  No orders of the defined types were placed in this encounter.    Chief Complaint  Patient presents with  . Shortness of Breath  . Edema    History of Present Illness:    Frederick Melhorn. is a 67 y.o. male with hypertension and prostate cancer with active surveillance who is being seen today for the evaluation of SOB with edema at the request of the patient   He has a long history of lower extremity edema it waxes and wanes and he intermittently takes a dose of furosemide.  He has stable exertional dyspnea and he goes outdoors takes the trash in and out but not inside no orthopnea he does snore but not severe and does not have sleep apnea.  He has no known  history of heart disease.  Frederick Carter significant other is concerned that he has underlying heart disease CAD or heart failure he says intermittently he has nonexertional chest tightness not severe sustained.  Unfortunately add salt to Frederick Carter diet.  He has no known history of liver or kidney disease.  Routine labs done with Frederick Carter physician 03/11/2018 shows a cholesterol 175 LDL 108 normal liver function normal renal function potassium 4.3 hemoglobin normal 14.6.  Clinically appears to have congestive heart failure I will ask him to take Frederick Carter diuretic daily begin to fully sodium restrict have labs done including BMP and proBNP and echocardiogram.  For evaluation for nonexertional chest pain and CAD stress myocardial perfusion study he will follow-up in the office in 2 months  Past Medical History:  Diagnosis Date  . Hypercholesteremia   . Hypertension   . Prostate cancer [facility])     Past Surgical History:  Procedure Laterality Date  . PROSTATE BIOPSY    . SHOULDER SURGERY Left     Current Medications: Current Meds  Medication Sig  . aspirin EC 81 MG tablet Take 81 mg by mouth daily.  . carvedilol (COREG) 12.5 MG tablet Take 12.5 mg by mouth 2 (two) times daily with a meal.  . furosemide (LASIX) 40 MG tablet Take 40 mg by mouth daily.  Marland Kitchen lisinopril-hydrochlorothiazide (PRINZIDE,ZESTORETIC) 20-25 MG tablet Take 1 tablet by mouth daily.  . Misc Natural Products (URINOZINC PLUS PO)  Take by mouth daily.  . multivitamin-lutein (OCUVITE-LUTEIN) CAPS capsule Take 1 capsule by mouth daily.  . tamsulosin (FLOMAX) 0.4 MG CAPS capsule Take 0.4 mg by mouth.     Allergies:   Patient has no known allergies.   Social History   Socioeconomic History  . Marital status: Single    Spouse name: Not on file  . Number of children: Not on file  . Years of education: Not on file  . Highest education level: Not on file  Occupational History  . Not on file  Social Needs  . Financial resource strain: Not on file  .  Food insecurity:    Worry: Not on file    Inability: Not on file  . Transportation needs:    Medical: Not on file    Non-medical: Not on file  Tobacco Use  . Smoking status: Never Smoker  . Smokeless tobacco: Never Used  Substance and Sexual Activity  . Alcohol use: Yes    Comment: occasional  . Drug use: No  . Sexual activity: Not on file  Lifestyle  . Physical activity:    Days per week: Not on file    Minutes per session: Not on file  . Stress: Not on file  Relationships  . Social connections:    Talks on phone: Not on file    Gets together: Not on file    Attends religious service: Not on file    Active member of club or organization: Not on file    Attends meetings of clubs or organizations: Not on file    Relationship status: Not on file  Other Topics Concern  . Not on file  Social History Narrative  . Not on file     Family History: The patient's family history includes Arthritis in Frederick Carter mother; Cancer in Frederick Carter brother, brother, and sister; Cancer (age of onset: 69) in Frederick Carter paternal uncle.  ROS:   Review of Systems  Constitution: Positive for malaise/fatigue and weight gain.  HENT: Negative.   Eyes: Negative.   Cardiovascular: Positive for chest pain, dyspnea on exertion and leg swelling.  Respiratory: Positive for shortness of breath and snoring.   Endocrine: Negative.   Hematologic/Lymphatic: Negative.   Skin: Negative.   Musculoskeletal: Positive for back pain.  Gastrointestinal: Negative.   Genitourinary: Negative.   Neurological: Negative.   Psychiatric/Behavioral: Negative.   Allergic/Immunologic: Negative.    Please see the history of present illness.     All other systems reviewed and are negative.  EKGs/Labs/Other Studies Reviewed:    The following studies were reviewed today:   EKG:  EKG is  ordered today.  The ekg ordered today demonstrates sinus rhythm normal  Recent Labs: No results found for requested labs within last 8760 hours.    Recent Lipid Panel No results found for: CHOL, TRIG, HDL, CHOLHDL, VLDL, LDLCALC, LDLDIRECT  Physical Exam:    VS:  BP 130/80 (BP Location: Left Arm, Patient Position: Sitting, Cuff Size: Large)   Pulse 60   Ht 5\' 7"  (1.702 m)   Wt 265 lb (120.2 kg)   SpO2 95%   BMI 41.50 kg/m     Wt Readings from Last 3 Encounters:  04/08/18 265 lb (120.2 kg)  01/01/17 245 lb 6 oz (111.3 kg)     GEN: Very obese BMI above 40 well nourished, well developed in no acute distress HEENT: Normal NECK: No JVD; No carotid bruits LYMPHATICS: No lymphadenopathy CARDIAC: RRR, no murmurs, rubs, gallops RESPIRATORY:  Clear  to auscultation without rales, wheezing or rhonchi  ABDOMEN: Soft, non-tender, non-distended MUSCULOSKELETAL:  No edema; No deformity  SKIN: Warm and dry NEUROLOGIC:  Alert and oriented x 3 PSYCHIATRIC:  Normal affect     Signed, Shirlee More, MD  04/08/2018 3:20 PM    [facility]

## 2018-04-08 ENCOUNTER — Encounter: Payer: Self-pay | Admitting: Cardiology

## 2018-04-08 ENCOUNTER — Ambulatory Visit (INDEPENDENT_AMBULATORY_CARE_PROVIDER_SITE_OTHER): Payer: Medicare Other | Admitting: Cardiology

## 2018-04-08 DIAGNOSIS — I10 Essential (primary) hypertension: Secondary | ICD-10-CM

## 2018-04-08 DIAGNOSIS — R0602 Shortness of breath: Secondary | ICD-10-CM

## 2018-04-08 HISTORY — DX: Essential (primary) hypertension: I10

## 2018-04-08 MED ORDER — FUROSEMIDE 40 MG PO TABS: 40.0000 mg | ORAL_TABLET | Freq: Every day | ORAL | 1 refills | Status: DC

## 2018-04-08 NOTE — Patient Instructions (Signed)
Medication Instructions:  Your physician has recommended you make the following change in your medication:   Take furosemide 40mg  one tablet daily   If you need a refill on your cardiac medications before your next appointment, please call your pharmacy.   Lab work: You will have lab work today: BMP, ProBNP, and D Dimer If you have labs (blood work) drawn today and your tests are completely normal, you will receive your results only by: Marland Kitchen MyChart Message (if you have MyChart) OR . A paper copy in the mail If you have any lab test that is abnormal or we need to change your treatment, we will call you to review the results.  Testing/Procedures: You had an EKG today  Your physician has requested that you have an echocardiogram. Echocardiography is a painless test that uses sound waves to create images of your heart. It provides your doctor with information about the size and shape of your heart and how well your heart's chambers and valves are working. This procedure takes approximately one hour. There are no restrictions for this procedure.  Your physician has requested that you have en exercise stress myoview. For further information please visit [email or web address]. Please follow instruction sheet, as given.    Follow-Up: At [facility], you and your health needs are our priority.  As part of our continuing mission to provide you with exceptional heart care, we have created designated Provider Care Teams.  These Care Teams include your primary Cardiologist (physician) and Advanced Practice Providers (APPs -  Physician Assistants and Nurse Practitioners) who all work together to provide you with the care you need, when you need it. You will need a follow up appointment in 1 months.     Echocardiogram An echocardiogram, or echocardiography, uses sound waves (ultrasound) to produce an image of your heart. The echocardiogram is simple, painless, obtained within a short period of time, and  offers valuable information to your health care provider. The images from an echocardiogram can provide information such as:  Evidence of coronary artery disease (CAD).  Heart size.  Heart muscle function.  Heart valve function.  Aneurysm detection.  Evidence of a past heart attack.  Fluid buildup around the heart.  Heart muscle thickening.  Assess heart valve function.  Tell a health care provider about:  Any allergies you have.  All medicines you are taking, including vitamins, herbs, eye drops, creams, and over-the-counter medicines.  Any problems you or family members have had with anesthetic medicines.  Any blood disorders you have.  Any surgeries you have had.  Any medical conditions you have.  Whether you are pregnant or may be pregnant. What happens before the procedure? No special preparation is needed. Eat and drink normally. What happens during the procedure?  In order to produce an image of your heart, gel will be applied to your chest and a wand-like tool (transducer) will be moved over your chest. The gel will help transmit the sound waves from the transducer. The sound waves will harmlessly bounce off your heart to allow the heart images to be captured in real-time motion. These images will then be recorded.  You may need an IV to receive a medicine that improves the quality of the pictures. What happens after the procedure? You may return to your normal schedule including diet, activities, and medicines, unless your health care provider tells you otherwise. This information is not intended to replace advice given to you by your health care provider. Make sure you discuss  any questions you have with your health care provider. Document Released: 04/07/2000 Document Revised: 11/27/2015 Document Reviewed: 12/16/2012 Elsevier Interactive Patient Education  2017 [employer].

## 2018-04-09 ENCOUNTER — Telehealth (HOSPITAL_COMMUNITY): Payer: Self-pay

## 2018-04-09 LAB — BASIC METABOLIC PANEL
BUN/Creatinine Ratio: 15 (ref 10–24)
BUN: 13 mg/dL (ref 8–27)
CALCIUM: 9.5 mg/dL (ref 8.6–10.2)
CHLORIDE: 103 mmol/L (ref 96–106)
CO2: 24 mmol/L (ref 20–29)
Creatinine, Ser: 0.88 mg/dL (ref 0.76–1.27)
GFR, EST AFRICAN AMERICAN: 101 mL/min/{1.73_m2} (ref >59)
GFR, EST NON AFRICAN AMERICAN: 88 mL/min/{1.73_m2} (ref >59)
Glucose: 88 mg/dL (ref 65–99)
Potassium: 4.1 mmol/L (ref 3.5–5.2)
Sodium: 142 mmol/L (ref 134–144)

## 2018-04-09 LAB — D-DIMER, QUANTITATIVE: D-DIMER: 0.38 mg/L FEU (ref 0.00–0.49)

## 2018-04-09 NOTE — Telephone Encounter (Signed)
Pt contacted and instructions given. Pt stated that he understood and would be here. S.Edna Grover EMTP

## 2018-04-10 ENCOUNTER — Other Ambulatory Visit (HOSPITAL_COMMUNITY): Payer: Medicare Other

## 2018-04-10 ENCOUNTER — Ambulatory Visit (HOSPITAL_BASED_OUTPATIENT_CLINIC_OR_DEPARTMENT_OTHER): Payer: Medicare Other

## 2018-04-10 ENCOUNTER — Ambulatory Visit (HOSPITAL_BASED_OUTPATIENT_CLINIC_OR_DEPARTMENT_OTHER)
Admission: RE | Admit: 2018-04-10 | Discharge: 2018-04-10 | Disposition: A | Discharge status: Home / Self Care | Payer: Medicare Other | Source: Ambulatory Visit | Attending: Cardiology | Admitting: Cardiology

## 2018-04-10 VITALS — Ht 67.0 in | Wt 265.0 lb

## 2018-04-10 DIAGNOSIS — I10 Essential (primary) hypertension: Secondary | ICD-10-CM

## 2018-04-10 DIAGNOSIS — R0602 Shortness of breath: Secondary | ICD-10-CM

## 2018-04-10 LAB — MYOCARDIAL PERFUSION IMAGING
CHL CUP NUCLEAR SRS: 0
CHL CUP NUCLEAR SSS: 0
CHL CUP RESTING HR STRESS: 80 {beats}/min
CSEPEW: 6.4 METS
CSEPPHR: 144 {beats}/min
Exercise duration (min): 4 min
Exercise duration (sec): 31 s
LV dias vol: 75 mL (ref 62–150)
LVSYSVOL: 26 mL
MPHR: 151 {beats}/min
NUC STRESS TID: 0.93
Percent HR: 95 %
SDS: 0

## 2018-04-10 MED ORDER — TECHNETIUM TC 99M TETROFOSMIN IV KIT
8.9000 | PACK | Freq: Once | INTRAVENOUS | Status: AC | PRN
  Administered 2018-04-10: 8.9 via INTRAVENOUS
  Filled 2018-04-10: qty 9

## 2018-04-10 MED ORDER — TECHNETIUM TC 99M TETROFOSMIN IV KIT
26.3000 | PACK | Freq: Once | INTRAVENOUS | Status: AC | PRN
  Administered 2018-04-10: 26.3 via INTRAVENOUS
  Filled 2018-04-10: qty 27

## 2018-04-10 NOTE — Progress Notes (Signed)
  Echocardiogram 2D Echocardiogram has been performed.  Frederick Carter Frederick Carter Frederick Carter 04/10/2018, 8:38 AM

## 2018-04-12 ENCOUNTER — Encounter (HOSPITAL_COMMUNITY): Payer: Medicare Other

## 2018-04-24 DIAGNOSIS — R0600 Dyspnea, unspecified: Secondary | ICD-10-CM | POA: Insufficient documentation

## 2018-04-24 HISTORY — DX: Dyspnea, unspecified: R06.00

## 2018-05-05 NOTE — Progress Notes (Signed)
Cardiology Office Note:    Date:  05/06/2018   ID:  [name], DOB [birth year], MRN [id number]  PCP:  Helen Hashimoto., MD  Cardiologist:  Shirlee More, MD    Referring MD: Helen Hashimoto., MD    ASSESSMENT:    1. SOB (shortness of breath)   2. 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 Essential hypertension    PLAN:    In order of problems listed above:  1. Improved with diuretic although I do not think he has what I would call heart failure.  His blood pressure is better controlled his edema has improved we will continue his loop diuretic check his electrolytes today.  I asked her to fully sodium restrict. 2. Recheck echo or CT in 6 months 3. BP at target continue current treatment including ACE inhibitor diuretics   Next appointment: 6 months    Medication Adjustments/Labs and Tests Ordered: Current medicines are reviewed at length with the patient today.  Concerns regarding medicines are outlined above.  No orders of the defined types were placed in this encounter.  No orders of the defined types were placed in this encounter.   Chief Complaint  Patient presents with  . Follow-up    after testing  . Shortness of Breath    History of Present Illness:    Frederick Carter. is a 68 y.o. male with a hx of hypertension and prostate cancer with active surveillance BMI>40  SOB and edema last seen 04/08/18. D Dimer was low risk at  0.38 and echo did not show findings of systolic or diastolic heart failure Compliance with diet, lifestyle and medications: Yes  At home his weight is down 6 to 8 pounds less edema he feels more confident he still intermittently short of breath and says that he wheezes at times.  His ischemia evaluation was normal echo did not show findings of heart failure d-dimer was low excluding venous thromboembolism in this situation.  I asked him to get back to his exercise program sodium restrict and will see back in the office in 6 months for  hypertension and enlarged thoracic aorta  Echo 04/10/18:  Impressions:    1. Left ventricular systolic function is preserved visually estimated ejection fraction is 60 to 65%. Impaired left   ventricular relaxation.   2. Mild tricuspid regurgitation   3. 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  MPI report: Study Highlights    Nuclear stress EF: 65%. No wall motion abnormalities  There was no ST segment deviation noted during stress.  The study is normal.  This is a low risk study. No ischemia identified. Candee Furbish, MD    Past Medical History:  Diagnosis Date  . Hypercholesteremia   . Hypertension   . Prostate cancer [facility])     Past Surgical History:  Procedure Laterality Date  . PROSTATE BIOPSY    . SHOULDER SURGERY Left     Current Medications: No outpatient medications have been marked as taking for the 05/06/18 encounter (Office Visit) with Richardo Priest, MD.     Allergies:   Patient has no known allergies.   Social History   Socioeconomic History  . Marital status: Single    Spouse name: Not on file  . Number of children: Not on file  . Years of education: Not on file  . Highest education level: Not on file  Occupational History  . Not on file  Social Needs  . Financial resource strain:  Not on file  . Food insecurity:    Worry: Not on file    Inability: Not on file  . Transportation needs:    Medical: Not on file    Non-medical: Not on file  Tobacco Use  . Smoking status: Never Smoker  . Smokeless tobacco: Never Used  Substance and Sexual Activity  . Alcohol use: Yes    Comment: occasional  . Drug use: No  . Sexual activity: Not on file  Lifestyle  . Physical activity:    Days per week: Not on file    Minutes per session: Not on file  . Stress: Not on file  Relationships  . Social connections:    Talks on phone: Not on file    Gets together: Not on file    Attends religious service: Not on file    Active member of club or  organization: Not on file    Attends meetings of clubs or organizations: Not on file    Relationship status: Not on file  Other Topics Concern  . Not on file  Social History Narrative  . Not on file     Family History: The patient's family history includes Arthritis in his mother; Cancer in his brother, brother, and sister; Cancer (age of onset: 76) in his paternal uncle. ROS:   Please see the history of present illness.    All other systems reviewed and are negative.  EKGs/Labs/Other Studies Reviewed:    The following studies were reviewed today:   Recent Labs: 04/08/2018: BUN 13; Creatinine, Ser 0.88; Potassium 4.1; Sodium 142  Recent Lipid Panel No results found for: CHOL, TRIG, HDL, CHOLHDL, VLDL, LDLCALC, LDLDIRECT  Physical Exam:    VS:  There were no vitals taken for this visit.    Wt Readings from Last 3 Encounters:  04/10/18 265 lb (120.2 kg)  04/08/18 265 lb (120.2 kg)  01/01/17 245 lb 6 oz (111.3 kg)     GEN:  Well nourished, well developed in no acute distress HEENT: Normal NECK: No JVD; No carotid bruits LYMPHATICS: No lymphadenopathy CARDIAC: RRR, no murmurs, rubs, gallops RESPIRATORY:  Clear to auscultation without rales, wheezing or rhonchi  ABDOMEN: Soft, non-tender, non-distended MUSCULOSKELETAL:  1+ to the knee bilateral edema; No deformity  SKIN: Warm and dry NEUROLOGIC:  Alert and oriented x 3 PSYCHIATRIC:  Normal affect    Signed, Shirlee More, MD  05/06/2018 8:01 AM    [place]

## 2018-05-06 ENCOUNTER — Telehealth: Payer: Self-pay | Admitting: Cardiology

## 2018-05-06 ENCOUNTER — Ambulatory Visit (INDEPENDENT_AMBULATORY_CARE_PROVIDER_SITE_OTHER): Payer: Medicare Other | Admitting: Cardiology

## 2018-05-06 ENCOUNTER — Encounter: Payer: Self-pay | Admitting: Cardiology

## 2018-05-06 VITALS — BP 122/82 | HR 74 | Ht 67.0 in | Wt 264.0 lb

## 2018-05-06 DIAGNOSIS — I7789 Other specified disorders of arteries and arterioles: Secondary | ICD-10-CM | POA: Insufficient documentation

## 2018-05-06 DIAGNOSIS — R0602 Shortness of breath: Secondary | ICD-10-CM | POA: Diagnosis not present

## 2018-05-06 DIAGNOSIS — I10 Essential (primary) hypertension: Secondary | ICD-10-CM

## 2018-05-06 HISTORY — DX: Other specified disorders of arteries and arterioles: I77.89

## 2018-05-06 NOTE — Patient Instructions (Addendum)
Medication Instructions:  Your physician recommends that you continue on your current medications as directed. Please refer to the Current Medication list given to you today.  If you need a refill on your cardiac medications before your next appointment, please call your pharmacy.   Lab work: Your physician recommends that you return for lab work today: BMP.   If you have labs (blood work) drawn today and your tests are completely normal, you will receive your results only by: Marland Kitchen MyChart Message (if you have MyChart) OR . A paper copy in the mail If you have any lab test that is abnormal or we need to change your treatment, we will call you to review the results.  Testing/Procedures: None  Follow-Up: At [facility], you and your health needs are our priority.  As part of our continuing mission to provide you with exceptional heart care, we have created designated Provider Care Teams.  These Care Teams include your primary Cardiologist (physician) and Advanced Practice Providers (APPs -  Physician Assistants and Nurse Practitioners) who all work together to provide you with the care you need, when you need it. You will need a follow up appointment in 6 months.  Please call our office 2 months in advance to schedule this appointment.      DASH diet: Healthy eating to lower your blood pressure The DASH diet emphasizes portion size, eating a variety of foods and getting the right amount of nutrients. Discover how DASH can improve your health and lower your blood pressure. By [facility] Staff  DASH stands for Dietary Approaches to Stop Hypertension. The DASH diet is a lifelong approach to healthy eating that's designed to help treat or prevent high blood pressure (hypertension). The DASH diet encourages you to reduce the sodium in your diet and eat a variety of foods rich in nutrients that help lower blood pressure, such as potassium, calcium and magnesium. By following the DASH diet, you may  be able to reduce your blood pressure by a few points in just two weeks. Over time, your systolic blood pressure could drop by eight to 14 points, which can make a significant difference in your health risks. Because the DASH diet is a healthy way of eating, it offers health benefits besides just lowering blood pressure. The DASH diet is also in line with dietary recommendations to prevent osteoporosis, cancer, heart disease, stroke and diabetes. DASH diet: Sodium levels The DASH diet emphasizes vegetables, fruits and low-fat dairy foods - and moderate amounts of whole grains, fish, poultry and nuts. In addition to the standard DASH diet, there is also a lower sodium version of the diet. You can choose the version of the diet that meets your health needs: Standard DASH diet. You can consume up to 2,300 milligrams (mg) of sodium a day.  Lower sodium DASH diet. You can consume up to 1,500 mg of sodium a day. Both versions of the DASH diet aim to reduce the amount of sodium in your diet compared with what you might get in a typical American diet, which can amount to a whopping 3,400 mg of sodium a day or more. The standard DASH diet meets the recommendation from the Dietary Guidelines for Americans to keep daily sodium intake to less than 2,300 mg a day. The American Heart Association recommends 1,500 mg a day of sodium as an upper limit for all adults. If you aren't sure what sodium level is right for you, talk to your doctor. DASH diet: What  to eat Both versions of the DASH diet include lots of whole grains, fruits, vegetables and low-fat dairy products. The DASH diet also includes some fish, poultry and legumes, and encourages a small amount of nuts and seeds a few times a week.  You can eat red meat, sweets and fats in small amounts. The DASH diet is low in saturated fat, cholesterol and total fat. Here's a look at the recommended servings from each food group for the 2,000-calorie-a-day DASH  diet. Grains: 6 to 8 servings a day Grains include bread, cereal, rice and pasta. Examples of one serving of grains include 1 slice whole-wheat bread, 1 ounce dry cereal, or 1/2 cup cooked cereal, rice or pasta. Focus on whole grains because they have more fiber and nutrients than do refined grains. For instance, use brown rice instead of white rice, whole-wheat pasta instead of regular pasta and whole-grain bread instead of white bread. Look for products labeled "100 percent whole grain" or "100 percent whole wheat."  Grains are naturally low in fat. Keep them this way by avoiding butter, cream and cheese sauces. Vegetables: 4 to 5 servings a day Tomatoes, carrots, broccoli, sweet potatoes, greens and other vegetables are full of fiber, vitamins, and such minerals as potassium and magnesium. Examples of one serving include 1 cup raw leafy green vegetables or 1/2 cup cut-up raw or cooked vegetables. Don't think of vegetables only as side dishes - a hearty blend of vegetables served over brown rice or whole-wheat noodles can serve as the main dish for a meal.  Fresh and frozen vegetables are both good choices. When buying frozen and canned vegetables, choose those labeled as low sodium or without added salt.  To increase the number of servings you fit in daily, be creative. In a stir-fry, for instance, cut the amount of meat in half and double up on the vegetables. Fruits: 4 to 5 servings a day Many fruits need little preparation to become a healthy part of a meal or snack. Like vegetables, they're packed with fiber, potassium and magnesium and are typically low in fat - coconuts are an exception. Examples of one serving include one medium fruit, 1/2 cup fresh, frozen or canned fruit, or 4 ounces of juice. Have a piece of fruit with meals and one as a snack, then round out your day with a dessert of fresh fruits topped with a dollop of low-fat yogurt.  Leave on edible peels whenever possible. The peels  of apples, pears and most fruits with pits add interesting texture to recipes and contain healthy nutrients and fiber.  Remember that citrus fruits and juices, such as grapefruit, can interact with certain medications, so check with your doctor or pharmacist to see if they're OK for you.  If you choose canned fruit or juice, make sure no sugar is added. Dairy: 2 to 3 servings a day Milk, yogurt, cheese and other dairy products are major sources of calcium, vitamin D and protein. But the key is to make sure that you choose dairy products that are low fat or fat-free because otherwise they can be a major source of fat - and most of it is saturated. Examples of one serving include 1 cup skim or 1 percent milk, 1 cup low fat yogurt, or 1 1/2 ounces part-skim cheese. Low-fat or fat-free frozen yogurt can help you boost the amount of dairy products you eat while offering a sweet treat. Add fruit for a healthy twist.  If you have trouble digesting  dairy products, choose lactose-free products or consider taking an over-the-counter product that contains the enzyme lactase, which can reduce or prevent the symptoms of lactose intolerance.  Go easy on regular and even fat-free cheeses because they are typically high in sodium. Lean meat, poultry and fish: 6 servings or fewer a day Meat can be a rich source of protein, B vitamins, iron and zinc. Choose lean varieties and aim for no more than 6 ounces a day. Cutting back on your meat portion will allow room for more vegetables. Trim away skin and fat from poultry and meat and then bake, broil, grill or roast instead of frying in fat.  Eat heart-healthy fish, such as salmon, herring and tuna. These types of fish are high in omega-3 fatty acids, which can help lower your total cholesterol. Nuts, seeds and legumes: 4 to 5 servings a week Almonds, sunflower seeds, kidney beans, peas, lentils and other foods in this family are good sources of magnesium, potassium and  protein. They're also full of fiber and phytochemicals, which are plant compounds that may protect against some cancers and cardiovascular disease. Serving sizes are small and are intended to be consumed only a few times a week because these foods are high in calories. Examples of one serving include 1/3 cup nuts, 2 tablespoons seeds, or 1/2 cup cooked beans or peas.  Nuts sometimes get a bad rap because of their fat content, but they contain healthy types of fat - monounsaturated fat and omega-3 fatty acids. They're high in calories, however, so eat them in moderation. Try adding them to stir-fries, salads or cereals.  Soybean-based products, such as tofu and tempeh, can be a good alternative to meat because they contain all of the amino acids your body needs to make a complete protein, just like meat. Fats and oils: 2 to 3 servings a day Fat helps your body absorb essential vitamins and helps your body's immune system. But too much fat increases your risk of heart disease, diabetes and obesity. The DASH diet strives for a healthy balance by limiting total fat to less than 30 percent of daily calories from fat, with a focus on the healthier monounsaturated fats. Examples of one serving include 1 teaspoon soft margarine, 1 tablespoon mayonnaise or 2 tablespoons salad dressing. Saturated fat and trans fat are the main dietary culprits in increasing your risk of coronary artery disease. DASH helps keep your daily saturated fat to less than 6 percent of your total calories by limiting use of meat, butter, cheese, whole milk, cream and eggs in your diet, along with foods made from lard, solid shortenings, and palm and coconut oils.  Avoid trans fat, commonly found in such processed foods as crackers, baked goods and fried items.  Read food labels on margarine and salad dressing so that you can choose those that are lowest in saturated fat and free of trans fat. Sweets: 5 servings or fewer a week You don't  have to banish sweets entirely while following the DASH diet - just go easy on them. Examples of one serving include 1 tablespoon sugar, jelly or jam, 1/2 cup sorbet, or 1 cup lemonade. When you eat sweets, choose those that are fat-free or low-fat, such as sorbets, fruit ices, jelly beans, hard candy, graham crackers or low-fat cookies.  Artificial sweeteners such as aspartame (NutraSweet, Equal) and sucralose (Splenda) may help satisfy your sweet tooth while sparing the sugar. But remember that you still must use them sensibly. It's OK to swap  a diet cola for a regular cola, but not in place of a more nutritious beverage such as low-fat milk or even plain water.  Cut back on added sugar, which has no nutritional value but can pack on calories. DASH diet: Alcohol and caffeine Drinking too much alcohol can increase blood pressure. The Dietary Guidelines for Americans recommends that men limit alcohol to no more than two drinks a day and women to one or less. The DASH diet doesn't address caffeine consumption. The influence of caffeine on blood pressure remains unclear. But caffeine can cause your blood pressure to rise at least temporarily. If you already have high blood pressure or if you think caffeine is affecting your blood pressure, talk to your doctor about your caffeine consumption. DASH diet and weight loss While the DASH diet is not a weight-loss program, you may indeed lose unwanted pounds because it can help guide you toward healthier food choices. The DASH diet generally includes about 2,000 calories a day. If you're trying to lose weight, you may need to eat fewer calories. You may also need to adjust your serving goals based on your individual circumstances - something your health care team can help you decide. Tips to cut back on sodium The foods at the core of the DASH diet are naturally low in sodium. So just by following the DASH diet, you're likely to reduce your sodium intake. You also  reduce sodium further by: Using sodium-free spices or flavorings with your food instead of salt  Not adding salt when cooking rice, pasta or hot cereal  Rinsing canned foods to remove some of the sodium  Buying foods labeled "no salt added," "sodium-free," "low sodium" or "very low sodium" One teaspoon of table salt has 2,325 mg of sodium. When you read food labels, you may be surprised at just how much sodium some processed foods contain. Even low-fat soups, canned vegetables, ready-to-eat cereals and sliced [place] from the local deli - foods you may have considered healthy - often have lots of sodium. You may notice a difference in taste when you choose low-sodium food and beverages. If things seem too bland, gradually introduce low-sodium foods and cut back on table salt until you reach your sodium goal. That'll give your palate time to adjust. Using salt-free seasoning blends or herbs and spices may also ease the transition. It can take several weeks for your taste buds to get used to less salty foods. Putting the pieces of the DASH diet together Try these strategies to get started on the DASH diet:  Change gradually. If you now eat only one or two servings of fruits or vegetables a day, try to add a serving at lunch and one at dinner. Rather than switching to all whole grains, start by making one or two of your grain servings whole grains. Increasing fruits, vegetables and whole grains gradually can also help prevent bloating or diarrhea that may occur if you aren't used to eating a diet with lots of fiber. You can also try over-the-counter products to help reduce gas from beans and vegetables.  Reward successes and forgive slip-ups. Reward yourself with a nonfood treat for your accomplishments - rent a movie, purchase a book or get together with a friend. Everyone slips, especially when learning something new. Remember that changing your lifestyle is a long-term process. Find out what triggered your  setback and then just pick up where you left off with the DASH diet.  Add physical activity. To boost your  blood pressure lowering efforts even more, consider increasing your physical activity in addition to following the DASH diet. Combining both the DASH diet and physical activity makes it more likely that you'll reduce your blood pressure.  Get support if you need it. If you're having trouble sticking to your diet, talk to your doctor or dietitian about it. You might get some tips that will help you stick to the DASH diet. Remember, healthy eating isn't an all-or-nothing proposition. What's most important is that, on average, you eat healthier foods with plenty of variety - both to keep your diet nutritious and to avoid boredom or extremes. And with the DASH diet, you can have both.

## 2018-05-07 LAB — BASIC METABOLIC PANEL
BUN/Creatinine Ratio: 14 (ref 10–24)
BUN: 14 mg/dL (ref 8–27)
CO2: 26 mmol/L (ref 20–29)
Calcium: 10.3 mg/dL — ABNORMAL HIGH (ref 8.6–10.2)
Chloride: 101 mmol/L (ref 96–106)
Creatinine, Ser: 0.98 mg/dL (ref 0.76–1.27)
GFR calc Af Amer: 90 mL/min/{1.73_m2} (ref >59)
GFR, EST NON AFRICAN AMERICAN: 78 mL/min/{1.73_m2} (ref >59)
Glucose: 97 mg/dL (ref 65–99)
Potassium: 4.3 mmol/L (ref 3.5–5.2)
Sodium: 144 mmol/L (ref 134–144)

## 2018-05-22 NOTE — Telephone Encounter (Signed)
error 

## 2018-06-03 DIAGNOSIS — K219 Gastro-esophageal reflux disease without esophagitis: Secondary | ICD-10-CM | POA: Diagnosis not present

## 2018-06-03 DIAGNOSIS — E785 Hyperlipidemia, unspecified: Secondary | ICD-10-CM | POA: Diagnosis not present

## 2018-06-03 DIAGNOSIS — R609 Edema, unspecified: Secondary | ICD-10-CM | POA: Diagnosis not present

## 2018-06-03 DIAGNOSIS — I10 Essential (primary) hypertension: Secondary | ICD-10-CM | POA: Diagnosis not present

## 2018-06-26 ENCOUNTER — Other Ambulatory Visit: Payer: Self-pay

## 2018-06-26 DIAGNOSIS — R0602 Shortness of breath: Secondary | ICD-10-CM

## 2018-06-26 DIAGNOSIS — I10 Essential (primary) hypertension: Secondary | ICD-10-CM

## 2018-06-26 MED ORDER — FUROSEMIDE 40 MG PO TABS: 40.0000 mg | ORAL_TABLET | Freq: Every day | ORAL | 2 refills | Status: DC

## 2018-09-10 DIAGNOSIS — M1712 Unilateral primary osteoarthritis, left knee: Secondary | ICD-10-CM | POA: Diagnosis not present

## 2018-09-18 DIAGNOSIS — M1712 Unilateral primary osteoarthritis, left knee: Secondary | ICD-10-CM | POA: Diagnosis not present

## 2018-10-07 DIAGNOSIS — I10 Essential (primary) hypertension: Secondary | ICD-10-CM | POA: Diagnosis not present

## 2018-10-07 DIAGNOSIS — R609 Edema, unspecified: Secondary | ICD-10-CM | POA: Diagnosis not present

## 2018-10-07 DIAGNOSIS — E785 Hyperlipidemia, unspecified: Secondary | ICD-10-CM | POA: Diagnosis not present

## 2018-10-07 DIAGNOSIS — Z9181 History of falling: Secondary | ICD-10-CM | POA: Diagnosis not present

## 2018-10-07 DIAGNOSIS — Z1331 Encounter for screening for depression: Secondary | ICD-10-CM | POA: Diagnosis not present

## 2018-10-07 DIAGNOSIS — K219 Gastro-esophageal reflux disease without esophagitis: Secondary | ICD-10-CM | POA: Diagnosis not present

## 2018-12-11 DIAGNOSIS — M25562 Pain in left knee: Secondary | ICD-10-CM | POA: Diagnosis not present

## 2018-12-11 DIAGNOSIS — G8929 Other chronic pain: Secondary | ICD-10-CM | POA: Diagnosis not present

## 2018-12-11 HISTORY — DX: Other chronic pain: G89.29

## 2018-12-16 DIAGNOSIS — Z01818 Encounter for other preprocedural examination: Secondary | ICD-10-CM | POA: Diagnosis not present

## 2018-12-16 DIAGNOSIS — I7789 Other specified disorders of arteries and arterioles: Secondary | ICD-10-CM | POA: Diagnosis not present

## 2018-12-16 DIAGNOSIS — I10 Essential (primary) hypertension: Secondary | ICD-10-CM | POA: Diagnosis not present

## 2018-12-16 DIAGNOSIS — M1712 Unilateral primary osteoarthritis, left knee: Secondary | ICD-10-CM | POA: Diagnosis not present

## 2018-12-16 NOTE — Addendum Note (Signed)
Addended by: Beckey Rutter on: 12/16/2018 02:49 PM   Modules accepted: Orders

## 2018-12-17 ENCOUNTER — Other Ambulatory Visit: Payer: Self-pay

## 2018-12-17 ENCOUNTER — Ambulatory Visit (HOSPITAL_BASED_OUTPATIENT_CLINIC_OR_DEPARTMENT_OTHER)
Admission: RE | Admit: 2018-12-17 | Discharge: 2018-12-17 | Disposition: A | Discharge status: Home / Self Care | Payer: Medicare Other | Source: Ambulatory Visit | Attending: Cardiology | Admitting: Cardiology

## 2018-12-17 DIAGNOSIS — I10 Essential (primary) hypertension: Secondary | ICD-10-CM | POA: Insufficient documentation

## 2018-12-17 DIAGNOSIS — R0602 Shortness of breath: Secondary | ICD-10-CM

## 2018-12-17 NOTE — Progress Notes (Signed)
  Echocardiogram 2D Echocardiogram has been performed.  Frederick Carter 12/17/2018, 8:46 AM

## 2018-12-19 ENCOUNTER — Other Ambulatory Visit (HOSPITAL_BASED_OUTPATIENT_CLINIC_OR_DEPARTMENT_OTHER): Payer: Medicare Other

## 2019-01-01 ENCOUNTER — Encounter (HOSPITAL_COMMUNITY): Payer: Self-pay

## 2019-01-01 NOTE — Patient Instructions (Addendum)
DUE TO COVID-19 ONLY ONE VISITOR IS ALLOWED TO COME WITH YOU AND STAY IN THE WAITING ROOM ONLY DURING PRE OP AND PROCEDURE. THE ONE VISITOR MAY VISIT WITH YOU IN YOUR PRIVATE ROOM DURING VISITING HOURS ONLY!!   COVID SWAB TESTING MUST BE COMPLETED ON: Thursday, Sept. 17, 2020 at     [address] [place] -[facility] enter pre surgical testing line (Must self quarantine after testing. Follow instructions on handout.)             Your procedure is scheduled on: Monday, Sept. 21, 2020   Report to [facility] Main  Entrance    Report to admitting at 6:55 AM   Call this number if you have problems the morning of surgery [phone]   Do not eat food or drink liquids :After Midnight.   Brush your teeth the morning of surgery.   Do NOT smoke after Midnight   Take these medicines the morning of surgery with A SIP OF WATER: Carvedilol.                                You may not have any metal on your body including jewelry, and body piercings             Do not wear lotions, powders, perfumes/cologne, or deodorant                           Men may shave face and neck.   Do not bring valuables to the hospital. [place].   Contacts, dentures or bridgework may not be worn into surgery.   Bring small overnight bag day of surgery.   Special Instructions: Bring a copy of your healthcare power of attorney and living will documents         the day of surgery if you haven't scanned them in before.              Please read over the following fact sheets you were given:  [facility] - Preparing for Surgery Before surgery, you can play an important role.  Because skin is not sterile, your skin needs to be as free of germs as possible.  You can reduce the number of germs on your skin by washing with CHG (chlorahexidine gluconate) soap before surgery.  CHG is an antiseptic cleaner which kills germs and bonds with  the skin to continue killing germs even after washing. Please DO NOT use if you have an allergy to CHG or antibacterial soaps.  If your skin becomes reddened/irritated stop using the CHG and inform your nurse when you arrive at Short Stay. Do not shave (including legs and underarms) for at least 48 hours prior to the first CHG shower.  You may shave your face/neck.  Please follow these instructions carefully:  1.  Shower with CHG Soap the night before surgery and the  morning of surgery.  2.  If you choose to wash your hair, wash your hair first as usual with your normal  shampoo.  3.  After you shampoo, rinse your hair and body thoroughly to remove the shampoo.  4.  Use CHG as you would any other liquid soap.  You can apply chg directly to the skin and wash.  Gently with a scrungie or clean washcloth.  5.  Apply the CHG Soap to your body ONLY FROM THE NECK DOWN.   Do   not use on face/ open                           Wound or open sores. Avoid contact with eyes, ears mouth and   genitals (private parts).                       Wash face,  Genitals (private parts) with your normal soap.             6.  Wash thoroughly, paying special attention to the area where your    surgery  will be performed.  7.  Thoroughly rinse your body with warm water from the neck down.  8.  DO NOT shower/wash with your normal soap after using and rinsing off the CHG Soap.                9.  Pat yourself dry with a clean towel.            10.  Wear clean pajamas.            11.  Place clean sheets on your bed the night of your first shower and do not  sleep with pets. Day of Surgery : Do not apply any lotions/deodorants the morning of surgery.  Please wear clean clothes to the hospital/surgery center.  FAILURE TO FOLLOW THESE INSTRUCTIONS MAY RESULT IN THE CANCELLATION OF YOUR SURGERY  PATIENT SIGNATURE_________________________________  NURSE  SIGNATURE__________________________________  ________________________________________________________________________

## 2019-01-07 ENCOUNTER — Encounter (HOSPITAL_COMMUNITY): Payer: Self-pay

## 2019-01-07 ENCOUNTER — Encounter (HOSPITAL_COMMUNITY)
Admission: RE | Admit: 2019-01-07 | Discharge: 2019-01-07 | Disposition: A | Discharge status: Home / Self Care | Payer: Medicare Other | Source: Ambulatory Visit | Attending: Orthopedic Surgery | Admitting: Orthopedic Surgery

## 2019-01-07 ENCOUNTER — Other Ambulatory Visit: Payer: Self-pay

## 2019-01-07 DIAGNOSIS — Z01812 Encounter for preprocedural laboratory examination: Secondary | ICD-10-CM | POA: Insufficient documentation

## 2019-01-07 HISTORY — DX: Unspecified osteoarthritis, unspecified site: M19.90

## 2019-01-07 HISTORY — DX: Unspecified cataract: H26.9

## 2019-01-07 HISTORY — DX: Localized edema: R60.0

## 2019-01-07 HISTORY — DX: Morbid (severe) obesity due to excess calories: E66.01

## 2019-01-07 LAB — BASIC METABOLIC PANEL
Anion gap: 11 (ref 5–15)
BUN: 13 mg/dL (ref 8–23)
CO2: 29 mmol/L (ref 22–32)
Calcium: 9.2 mg/dL (ref 8.9–10.3)
Chloride: 104 mmol/L (ref 98–111)
Creatinine, Ser: 1.05 mg/dL (ref 0.61–1.24)
GFR calc Af Amer: >60 mL/min (ref >60)
GFR calc non Af Amer: >60 mL/min (ref >60)
Glucose, Bld: 105 mg/dL — ABNORMAL HIGH (ref 70–99)
Potassium: 3.9 mmol/L (ref 3.5–5.1)
Sodium: 144 mmol/L (ref 135–145)

## 2019-01-07 LAB — SURGICAL PCR SCREEN
MRSA, PCR: NEGATIVE
Staphylococcus aureus: POSITIVE — AB

## 2019-01-07 LAB — CBC
HCT: 45.1 % (ref 39.0–52.0)
Hemoglobin: 14.7 g/dL (ref 13.0–17.0)
MCH: 30.2 pg (ref 26.0–34.0)
MCHC: 32.6 g/dL (ref 30.0–36.0)
MCV: 92.6 fL (ref 80.0–100.0)
Platelets: 268 10*3/uL (ref 150–400)
RBC: 4.87 MIL/uL (ref 4.22–5.81)
RDW: 14.3 % (ref 11.5–15.5)
WBC: 6.4 10*3/uL (ref 4.0–10.5)
nRBC: 0 % (ref 0.0–0.2)

## 2019-01-07 NOTE — Progress Notes (Signed)
PCP - Dr. Megan Salon Cardiologist - B. Munley  Chest x-ray - N/A EKG - 04/08/2018 Stress Test - 04/10/2018 ECHO - 12/17/2018 Cardiac Cath - N/A  Sleep Study - N/A CPAP - N/A  Fasting Blood Sugar - N/A Checks Blood Sugar _____ times a day  Blood Thinner Instructions: Aspirin Instructions:81mg  Aspirin. patient to check with surgeon about stopping. Last Dose:  Anesthesia review: N/A  Patient denies shortness of breath, fever, cough and chest pain at PAT appointment   Patient verbalized understanding of instructions that were given to them at the PAT appointment. Patient was also instructed that they will need to review over the PAT instructions again at home before surgery.

## 2019-01-07 NOTE — Progress Notes (Signed)
SPOKE W/  _ Frederick Carter     SCREENING SYMPTOMS OF COVID 19:   COUGH--No  RUNNY NOSE--- No  SORE THROAT---  NASAL CONGESTION----No  SNEEZING----No  SHORTNESS OF BREATH---No  DIFFICULTY BREATHING---No  TEMP >100.0 -----No  UNEXPLAINED BODY ACHES------No  CHILLS -------- No  HEADACHES ---------  LOSS OF SMELL/ TASTE --------No    HAVE YOU OR ANY FAMILY MEMBER TRAVELLED PAST 14 DAYS OUT OF THE   COUNTY---Yes STATE----No COUNTRY----No  HAVE YOU OR ANY FAMILY MEMBER BEEN EXPOSED TO ANYONE WITH COVID 19? No   SPOKE W/  _     SCREENING SYMPTOMS OF COVID 19:   COUGH--  RUNNY NOSE---   SORE THROAT---  NASAL CONGESTION----  SNEEZING----  SHORTNESS OF BREATH---  DIFFICULTY BREATHING---  TEMP >100.0 -----  UNEXPLAINED BODY ACHES------  CHILLS --------   HEADACHES ---------  LOSS OF SMELL/ TASTE --------    HAVE YOU OR ANY FAMILY MEMBER TRAVELLED PAST 14 DAYS OUT OF THE   COUNTY--- STATE---- COUNTRY----  HAVE YOU OR ANY FAMILY MEMBER BEEN EXPOSED TO ANYONE WITH COVID 19?

## 2019-01-08 ENCOUNTER — Other Ambulatory Visit: Payer: Self-pay | Admitting: Orthopedic Surgery

## 2019-01-08 NOTE — Anesthesia Preprocedure Evaluation (Addendum)
Anesthesia Evaluation  Patient identified by MRN, date of birth, ID band Patient awake    Reviewed: Allergy & Precautions, NPO status , Patient's Chart, lab work & pertinent test results, reviewed documented beta blocker date and time   Airway Mallampati: III  TM Distance: >3 FB Neck ROM: Full    Dental no notable dental hx.    Pulmonary neg pulmonary ROS,    Pulmonary exam normal breath sounds clear to auscultation       Cardiovascular hypertension, Pt. on home beta blockers and Pt. on medications Normal cardiovascular exam Rhythm:Regular Rate:Normal  Myocardial Perfusion Nuclear stress EF: 65%. No wall motion abnormalities There was no ST segment deviation noted during stress. The study is normal. This is a low risk study. No ischemia identified.  ECHO: 1. Left ventricular systolic function is preserved visually estimated ejection fraction is 60 to 65%. Impaired left ventricular relaxation. 2. Mild tricuspid regurgitation 3. 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  ECG: NSR, rate 83   Neuro/Psych negative neurological ROS  negative psych ROS   GI/Hepatic negative GI ROS, Neg liver ROS,   Endo/Other  Morbid obesity  Renal/GU negative Renal ROS     Musculoskeletal  (+) Arthritis ,   Abdominal (+) + obese,   Peds  Hematology HLD   Anesthesia Other Findings  LEFT KNEE OSTEOARTHRITIS  Reproductive/Obstetrics                            Anesthesia Physical Anesthesia Plan  ASA: III  Anesthesia Plan: Spinal and Regional   Post-op Pain Management:  Regional for Post-op pain   Induction: Intravenous  PONV Risk Score and Plan: 1 and Ondansetron, Dexamethasone, Midazolam and Treatment may vary due to age or medical condition  Airway Management Planned: Natural Airway  Additional Equipment:   Intra-op Plan:   Post-operative Plan:   Informed Consent: I have reviewed the  patients History and Physical, chart, labs and discussed the procedure including the risks, benefits and alternatives for the proposed anesthesia with the patient or authorized representative who has indicated his/her understanding and acceptance.     Dental advisory given  Plan Discussed with: CRNA  Anesthesia Plan Comments: (Reviewed PAT note 01/07/2019, Konrad Felix, PA-C)       Anesthesia Quick Evaluation

## 2019-01-08 NOTE — Progress Notes (Signed)
Anesthesia Chart Review   Case: [id number] Date/Time: 01/13/19 0910   Procedure: TOTAL KNEE ARTHROPLASTY (Left Knee)   Anesthesia type: Spinal   Pre-op diagnosis: LT. KNEE OSTEOARTHRITIS   Location: [facility] / [facility]   Surgeon: Vickey Huger, MD      DISCUSSION:68 y.o. never smoker with h/o HTN, prostate cancer, left knee OA scheduled for above procedure 01/13/2019 with Dr. Vickey Huger.   Pt seen by cardiologist, Dr. Shirlee More, 04/08/18 for evaluation of shortness of breath and edema.  His ischemia evaluation was normal echo did not show findings of heart failure d-dimer was low excluding venous thromboembolism in this situation.  Echo rechecked in 12/17/2018, stable, no evidence of CHF, EF 60-65%.   Anticipate pt can proceed with planned procedure barring acute status change.   VS: BP 137/81   Pulse 66   Temp 36.9 C (Oral)   Resp 18   Ht 5\' 6"  (1.676 m)   Wt 116.1 kg   SpO2 97%   BMI 41.33 kg/m   PROVIDERS: Helen Hashimoto., MD is PCP   Shirlee More, MD is Cardiologist  LABS: Labs reviewed: Acceptable for surgery. (all labs ordered are listed, but only abnormal results are displayed)  Labs Reviewed  SURGICAL PCR SCREEN - Abnormal; Notable for the following components:      Result Value   Staphylococcus aureus POSITIVE (*)    All other components within normal limits  BASIC METABOLIC PANEL - Abnormal; Notable for the following components:   Glucose, Bld 105 (*)    All other components within normal limits  CBC     IMAGES:   EKG: 04/08/18 Rate 63 bpm Normal sinus rhythm  Normal ECG   CV: Echo 12/17/2018 IMPRESSIONS   1. The left ventricle has normal systolic function with an ejection fraction of 60-65%. The cavity size was normal. Left ventricular diastolic Doppler parameters are consistent with impaired relaxation.  2. The right ventricle has normal systolic function. The cavity was normal. There is no increase in right ventricular wall thickness.  3.  The aorta is abnormal unless otherwise noted.  4. There is mild dilatation of the ascending aorta measuring 39 mm.  Stress Test 04/10/18  Nuclear stress EF: 65%. No wall motion abnormalities  There was no ST segment deviation noted during stress.  The study is normal.  This is a low risk study. No ischemia identified.   Candee Furbish, MD Past Medical History:  Diagnosis Date  . Arthritis   . Cataract   . Dyspnea 04/2018  . Hypercholesteremia   . Hypertension   . Lower extremity edema   . Morbid obesity ([place])   . Prostate cancer [facility])     Past Surgical History:  Procedure Laterality Date  . CATARACT EXTRACTION, BILATERAL  2015  . KNEE ARTHROSCOPY Left 2017  . PROSTATE BIOPSY    . SHOULDER SURGERY Left   . TRANSURETHRAL RESECTION OF PROSTATE  2014    MEDICATIONS: . aspirin EC 81 MG tablet  . carvedilol (COREG) 12.5 MG tablet  . furosemide (LASIX) 40 MG tablet  . lisinopril-hydrochlorothiazide (PRINZIDE,ZESTORETIC) 20-25 MG tablet  . Misc Natural Products (URINOZINC PLUS PO)  . multivitamin-lutein (OCUVITE-LUTEIN) CAPS capsule  . tamsulosin (FLOMAX) 0.4 MG CAPS capsule   No current facility-administered medications for this encounter.      Maia Plan [facility] Pre-Surgical Testing [phone] 01/08/19  2:28 PM

## 2019-01-09 ENCOUNTER — Other Ambulatory Visit (HOSPITAL_COMMUNITY)
Admission: RE | Admit: 2019-01-09 | Discharge: 2019-01-09 | Disposition: A | Discharge status: Home / Self Care | Payer: Medicare Other | Source: Ambulatory Visit | Attending: Orthopedic Surgery | Admitting: Orthopedic Surgery

## 2019-01-09 DIAGNOSIS — Z20828 Contact with and (suspected) exposure to other viral communicable diseases: Secondary | ICD-10-CM | POA: Insufficient documentation

## 2019-01-09 DIAGNOSIS — Z01812 Encounter for preprocedural laboratory examination: Secondary | ICD-10-CM | POA: Diagnosis not present

## 2019-01-10 LAB — NOVEL CORONAVIRUS, NAA (HOSP ORDER, SEND-OUT TO REF LAB; TAT 18-24 HRS): SARS-CoV-2, NAA: NOT DETECTED

## 2019-01-12 MED ORDER — BUPIVACAINE LIPOSOME 1.3 % IJ SUSP
20.0000 mL | Freq: Once | INTRAMUSCULAR | Status: DC
  Filled 2019-01-12: qty 20

## 2019-01-13 ENCOUNTER — Encounter (HOSPITAL_COMMUNITY): Payer: Self-pay | Admitting: General Practice

## 2019-01-13 ENCOUNTER — Observation Stay (HOSPITAL_COMMUNITY)
Admission: RE | Admit: 2019-01-13 | Discharge: 2019-01-14 | Disposition: A | Discharge status: Home / Self Care | Payer: Medicare Other | Attending: Orthopedic Surgery | Admitting: Orthopedic Surgery

## 2019-01-13 ENCOUNTER — Other Ambulatory Visit: Payer: Self-pay

## 2019-01-13 ENCOUNTER — Encounter (HOSPITAL_COMMUNITY)
Admission: RE | Disposition: A | Discharge status: Home / Self Care | Payer: Self-pay | Source: Home / Self Care | Attending: Orthopedic Surgery

## 2019-01-13 ENCOUNTER — Ambulatory Visit (HOSPITAL_COMMUNITY): Payer: Medicare Other | Admitting: Anesthesiology

## 2019-01-13 ENCOUNTER — Ambulatory Visit (HOSPITAL_COMMUNITY): Payer: Medicare Other | Admitting: Physician Assistant

## 2019-01-13 DIAGNOSIS — Z6841 Body Mass Index (BMI) 40.0 and over, adult: Secondary | ICD-10-CM | POA: Insufficient documentation

## 2019-01-13 DIAGNOSIS — Z8546 Personal history of malignant neoplasm of prostate: Secondary | ICD-10-CM | POA: Diagnosis not present

## 2019-01-13 DIAGNOSIS — C61 Malignant neoplasm of prostate: Secondary | ICD-10-CM | POA: Diagnosis not present

## 2019-01-13 DIAGNOSIS — M1712 Unilateral primary osteoarthritis, left knee: Secondary | ICD-10-CM | POA: Diagnosis not present

## 2019-01-13 DIAGNOSIS — Z96659 Presence of unspecified artificial knee joint: Secondary | ICD-10-CM

## 2019-01-13 DIAGNOSIS — I10 Essential (primary) hypertension: Secondary | ICD-10-CM | POA: Diagnosis not present

## 2019-01-13 DIAGNOSIS — M25762 Osteophyte, left knee: Secondary | ICD-10-CM | POA: Insufficient documentation

## 2019-01-13 DIAGNOSIS — G8918 Other acute postprocedural pain: Secondary | ICD-10-CM | POA: Diagnosis not present

## 2019-01-13 DIAGNOSIS — M25562 Pain in left knee: Secondary | ICD-10-CM | POA: Diagnosis present

## 2019-01-13 HISTORY — DX: Presence of unspecified artificial knee joint: Z96.659

## 2019-01-13 HISTORY — PX: TOTAL KNEE ARTHROPLASTY: SHX125

## 2019-01-13 LAB — CBC WITH DIFFERENTIAL/PLATELET
Abs Immature Granulocytes: 0.01 10*3/uL (ref 0.00–0.07)
Basophils Absolute: 0.1 10*3/uL (ref 0.0–0.1)
Basophils Relative: 1 %
Eosinophils Absolute: 0.2 10*3/uL (ref 0.0–0.5)
Eosinophils Relative: 4 %
HCT: 42.1 % (ref 39.0–52.0)
Hemoglobin: 13.9 g/dL (ref 13.0–17.0)
Immature Granulocytes: 0 %
Lymphocytes Relative: 27 %
Lymphs Abs: 1.5 10*3/uL (ref 0.7–4.0)
MCH: 30.3 pg (ref 26.0–34.0)
MCHC: 33 g/dL (ref 30.0–36.0)
MCV: 91.7 fL (ref 80.0–100.0)
Monocytes Absolute: 0.6 10*3/uL (ref 0.1–1.0)
Monocytes Relative: 10 %
Neutro Abs: 3.3 10*3/uL (ref 1.7–7.7)
Neutrophils Relative %: 58 %
Platelets: 258 10*3/uL (ref 150–400)
RBC: 4.59 MIL/uL (ref 4.22–5.81)
RDW: 13.7 % (ref 11.5–15.5)
WBC: 5.7 10*3/uL (ref 4.0–10.5)
nRBC: 0 % (ref 0.0–0.2)

## 2019-01-13 LAB — COMPREHENSIVE METABOLIC PANEL
ALT: 25 U/L (ref 0–44)
AST: 23 U/L (ref 15–41)
Albumin: 3.6 g/dL (ref 3.5–5.0)
Alkaline Phosphatase: 54 U/L (ref 38–126)
Anion gap: 11 (ref 5–15)
BUN: 18 mg/dL (ref 8–23)
CO2: 22 mmol/L (ref 22–32)
Calcium: 8.9 mg/dL (ref 8.9–10.3)
Chloride: 108 mmol/L (ref 98–111)
Creatinine, Ser: 0.9 mg/dL (ref 0.61–1.24)
GFR calc Af Amer: >60 mL/min (ref >60)
GFR calc non Af Amer: >60 mL/min (ref >60)
Glucose, Bld: 100 mg/dL — ABNORMAL HIGH (ref 70–99)
Potassium: 3.8 mmol/L (ref 3.5–5.1)
Sodium: 141 mmol/L (ref 135–145)
Total Bilirubin: 0.7 mg/dL (ref 0.3–1.2)
Total Protein: 6.6 g/dL (ref 6.5–8.1)

## 2019-01-13 SURGERY — ARTHROPLASTY, KNEE, TOTAL
Anesthesia: Regional | Site: Knee | Laterality: Left

## 2019-01-13 MED ORDER — CEFAZOLIN SODIUM-DEXTROSE 2-4 GM/100ML-% IV SOLN
2.0000 g | Freq: Four times a day (QID) | INTRAVENOUS | Status: AC
  Administered 2019-01-13 (×2): 2 g via INTRAVENOUS
  Filled 2019-01-13 (×2): qty 100

## 2019-01-13 MED ORDER — HYDROMORPHONE HCL 1 MG/ML IJ SOLN: 0.5000 mg | INTRAMUSCULAR | Status: DC | PRN

## 2019-01-13 MED ORDER — PROPOFOL 500 MG/50ML IV EMUL
INTRAVENOUS | Status: DC | PRN
  Administered 2019-01-13: 25 ug/kg/min via INTRAVENOUS

## 2019-01-13 MED ORDER — FENTANYL CITRATE (PF) 100 MCG/2ML IJ SOLN
50.0000 ug | INTRAMUSCULAR | Status: DC
  Administered 2019-01-13: 100 ug via INTRAVENOUS
  Filled 2019-01-13: qty 2

## 2019-01-13 MED ORDER — TAMSULOSIN HCL 0.4 MG PO CAPS
0.4000 mg | ORAL_CAPSULE | Freq: Every day | ORAL | Status: DC
  Administered 2019-01-13: 0.4 mg via ORAL
  Filled 2019-01-13: qty 1

## 2019-01-13 MED ORDER — ONDANSETRON HCL 4 MG/2ML IJ SOLN: 4.0000 mg | Freq: Once | INTRAMUSCULAR | Status: DC | PRN

## 2019-01-13 MED ORDER — BUPIVACAINE HCL (PF) 0.25 % IJ SOLN
INTRAMUSCULAR | Status: DC | PRN
  Administered 2019-01-13: 30 mL

## 2019-01-13 MED ORDER — PHENOL 1.4 % MT LIQD: 1.0000 | OROMUCOSAL | Status: DC | PRN

## 2019-01-13 MED ORDER — CEFAZOLIN SODIUM-DEXTROSE 2-4 GM/100ML-% IV SOLN
INTRAVENOUS | Status: AC
  Filled 2019-01-13: qty 100

## 2019-01-13 MED ORDER — EPHEDRINE 5 MG/ML INJ
INTRAVENOUS | Status: AC
  Filled 2019-01-13: qty 10

## 2019-01-13 MED ORDER — GLYCOPYRROLATE 0.2 MG/ML IJ SOLN
INTRAMUSCULAR | Status: DC | PRN
  Administered 2019-01-13: 0.2 mg via INTRAVENOUS

## 2019-01-13 MED ORDER — METHOCARBAMOL 500 MG IVPB - SIMPLE MED
500.0000 mg | Freq: Four times a day (QID) | INTRAVENOUS | Status: DC | PRN
  Filled 2019-01-13: qty 50

## 2019-01-13 MED ORDER — METOCLOPRAMIDE HCL 5 MG/ML IJ SOLN: 5.0000 mg | Freq: Three times a day (TID) | INTRAMUSCULAR | Status: DC | PRN

## 2019-01-13 MED ORDER — BUPIVACAINE-EPINEPHRINE 0.25% -1:200000 IJ SOLN: INTRAMUSCULAR | Status: DC | PRN

## 2019-01-13 MED ORDER — FUROSEMIDE 40 MG PO TABS
40.0000 mg | ORAL_TABLET | Freq: Every day | ORAL | Status: DC
  Administered 2019-01-13 – 2019-01-14 (×2): 40 mg via ORAL
  Filled 2019-01-13 (×2): qty 1

## 2019-01-13 MED ORDER — ROPIVACAINE HCL 5 MG/ML IJ SOLN
INTRAMUSCULAR | Status: DC | PRN
  Administered 2019-01-13: 30 mL via PERINEURAL

## 2019-01-13 MED ORDER — LACTATED RINGERS IV SOLN
INTRAVENOUS | Status: DC
  Administered 2019-01-13 (×3): via INTRAVENOUS

## 2019-01-13 MED ORDER — FENTANYL CITRATE (PF) 100 MCG/2ML IJ SOLN: 25.0000 ug | INTRAMUSCULAR | Status: DC | PRN

## 2019-01-13 MED ORDER — ONDANSETRON HCL 4 MG/2ML IJ SOLN
INTRAMUSCULAR | Status: AC
  Filled 2019-01-13: qty 2

## 2019-01-13 MED ORDER — CARVEDILOL 12.5 MG PO TABS
12.5000 mg | ORAL_TABLET | Freq: Two times a day (BID) | ORAL | Status: DC
  Administered 2019-01-13 – 2019-01-14 (×2): 12.5 mg via ORAL
  Filled 2019-01-13 (×2): qty 1

## 2019-01-13 MED ORDER — PHENYLEPHRINE 40 MCG/ML (10ML) SYRINGE FOR IV PUSH (FOR BLOOD PRESSURE SUPPORT)
PREFILLED_SYRINGE | INTRAVENOUS | Status: AC
  Filled 2019-01-13: qty 20

## 2019-01-13 MED ORDER — WATER FOR IRRIGATION, STERILE IR SOLN
Status: DC | PRN
  Administered 2019-01-13: 2000 mL

## 2019-01-13 MED ORDER — OXYCODONE HCL 5 MG PO TABS
5.0000 mg | ORAL_TABLET | ORAL | Status: DC | PRN
  Filled 2019-01-13: qty 2

## 2019-01-13 MED ORDER — CELECOXIB 200 MG PO CAPS
ORAL_CAPSULE | ORAL | Status: AC
  Filled 2019-01-13: qty 1

## 2019-01-13 MED ORDER — SODIUM CHLORIDE 0.9% FLUSH
INTRAVENOUS | Status: DC | PRN
  Administered 2019-01-13: 20 mL

## 2019-01-13 MED ORDER — EPHEDRINE SULFATE 50 MG/ML IJ SOLN
INTRAMUSCULAR | Status: DC | PRN
  Administered 2019-01-13 (×2): 5 mg via INTRAVENOUS

## 2019-01-13 MED ORDER — CHLORHEXIDINE GLUCONATE 4 % EX LIQD: 60.0000 mL | Freq: Once | CUTANEOUS | Status: DC

## 2019-01-13 MED ORDER — BUPIVACAINE LIPOSOME 1.3 % IJ SUSP
INTRAMUSCULAR | Status: DC | PRN
  Administered 2019-01-13: 20 mL

## 2019-01-13 MED ORDER — PROPOFOL 10 MG/ML IV BOLUS
INTRAVENOUS | Status: AC
  Filled 2019-01-13: qty 40

## 2019-01-13 MED ORDER — TRAMADOL HCL 50 MG PO TABS
50.0000 mg | ORAL_TABLET | Freq: Four times a day (QID) | ORAL | Status: DC
  Administered 2019-01-13 – 2019-01-14 (×3): 50 mg via ORAL
  Filled 2019-01-13 (×3): qty 1

## 2019-01-13 MED ORDER — LISINOPRIL 20 MG PO TABS
20.0000 mg | ORAL_TABLET | Freq: Every day | ORAL | Status: DC
  Administered 2019-01-13 – 2019-01-14 (×2): 20 mg via ORAL
  Filled 2019-01-13 (×2): qty 1

## 2019-01-13 MED ORDER — POVIDONE-IODINE 10 % EX SWAB
2.0000 "application " | Freq: Once | CUTANEOUS | Status: AC
  Administered 2019-01-13: 2 via TOPICAL

## 2019-01-13 MED ORDER — BUPIVACAINE HCL (PF) 0.25 % IJ SOLN
INTRAMUSCULAR | Status: AC
  Filled 2019-01-13: qty 30

## 2019-01-13 MED ORDER — TRANEXAMIC ACID-NACL 1000-0.7 MG/100ML-% IV SOLN
1000.0000 mg | Freq: Once | INTRAVENOUS | Status: AC
  Administered 2019-01-13: 15:00:00 1000 mg via INTRAVENOUS
  Filled 2019-01-13: qty 100

## 2019-01-13 MED ORDER — MIDAZOLAM HCL 2 MG/2ML IJ SOLN
INTRAMUSCULAR | Status: AC
  Filled 2019-01-13: qty 2

## 2019-01-13 MED ORDER — METOCLOPRAMIDE HCL 5 MG PO TABS: 5.0000 mg | ORAL_TABLET | Freq: Three times a day (TID) | ORAL | Status: DC | PRN

## 2019-01-13 MED ORDER — DIPHENHYDRAMINE HCL 12.5 MG/5ML PO ELIX: 12.5000 mg | ORAL_SOLUTION | ORAL | Status: DC | PRN

## 2019-01-13 MED ORDER — SENNOSIDES-DOCUSATE SODIUM 8.6-50 MG PO TABS: 1.0000 | ORAL_TABLET | Freq: Every evening | ORAL | Status: DC | PRN

## 2019-01-13 MED ORDER — ALUM & MAG HYDROXIDE-SIMETH 200-200-20 MG/5ML PO SUSP: 30.0000 mL | ORAL | Status: DC | PRN

## 2019-01-13 MED ORDER — DEXAMETHASONE SODIUM PHOSPHATE 10 MG/ML IJ SOLN: 10.0000 mg | Freq: Once | INTRAMUSCULAR | Status: DC

## 2019-01-13 MED ORDER — GLYCOPYRROLATE PF 0.2 MG/ML IJ SOSY
PREFILLED_SYRINGE | INTRAMUSCULAR | Status: AC
  Filled 2019-01-13: qty 1

## 2019-01-13 MED ORDER — CELECOXIB 200 MG PO CAPS
400.0000 mg | ORAL_CAPSULE | Freq: Once | ORAL | Status: AC
  Administered 2019-01-13: 08:00:00 400 mg via ORAL
  Filled 2019-01-13: qty 2

## 2019-01-13 MED ORDER — ONDANSETRON HCL 4 MG/2ML IJ SOLN: 4.0000 mg | Freq: Four times a day (QID) | INTRAMUSCULAR | Status: DC | PRN

## 2019-01-13 MED ORDER — FERROUS SULFATE 325 (65 FE) MG PO TABS
325.0000 mg | ORAL_TABLET | Freq: Three times a day (TID) | ORAL | Status: DC
  Administered 2019-01-14: 08:00:00 325 mg via ORAL
  Filled 2019-01-13: qty 1

## 2019-01-13 MED ORDER — ONDANSETRON HCL 4 MG/2ML IJ SOLN
INTRAMUSCULAR | Status: DC | PRN
  Administered 2019-01-13: 4 mg via INTRAVENOUS

## 2019-01-13 MED ORDER — DEXAMETHASONE SODIUM PHOSPHATE 10 MG/ML IJ SOLN
INTRAMUSCULAR | Status: AC
  Filled 2019-01-13: qty 1

## 2019-01-13 MED ORDER — FENTANYL CITRATE (PF) 100 MCG/2ML IJ SOLN
INTRAMUSCULAR | Status: DC | PRN
  Administered 2019-01-13: 50 ug via INTRAVENOUS
  Administered 2019-01-13: 25 ug via INTRAVENOUS

## 2019-01-13 MED ORDER — FENTANYL CITRATE (PF) 100 MCG/2ML IJ SOLN
INTRAMUSCULAR | Status: AC
  Filled 2019-01-13: qty 2

## 2019-01-13 MED ORDER — MENTHOL 3 MG MT LOZG: 1.0000 | LOZENGE | OROMUCOSAL | Status: DC | PRN

## 2019-01-13 MED ORDER — ASPIRIN EC 325 MG PO TBEC
325.0000 mg | DELAYED_RELEASE_TABLET | Freq: Two times a day (BID) | ORAL | Status: DC
  Administered 2019-01-14: 10:00:00 325 mg via ORAL
  Filled 2019-01-13: qty 1

## 2019-01-13 MED ORDER — BISACODYL 5 MG PO TBEC: 5.0000 mg | DELAYED_RELEASE_TABLET | Freq: Every day | ORAL | Status: DC | PRN

## 2019-01-13 MED ORDER — GABAPENTIN 300 MG PO CAPS
300.0000 mg | ORAL_CAPSULE | Freq: Once | ORAL | Status: AC
  Administered 2019-01-13: 08:00:00 300 mg via ORAL
  Filled 2019-01-13: qty 1

## 2019-01-13 MED ORDER — CEFAZOLIN SODIUM-DEXTROSE 2-4 GM/100ML-% IV SOLN
2.0000 g | INTRAVENOUS | Status: AC
  Administered 2019-01-13: 2 g via INTRAVENOUS
  Administered 2019-01-13: 1 g via INTRAVENOUS
  Filled 2019-01-13: qty 100

## 2019-01-13 MED ORDER — GABAPENTIN 300 MG PO CAPS
300.0000 mg | ORAL_CAPSULE | Freq: Three times a day (TID) | ORAL | Status: DC
  Administered 2019-01-13 – 2019-01-14 (×3): 300 mg via ORAL
  Filled 2019-01-13 (×3): qty 1

## 2019-01-13 MED ORDER — SODIUM CHLORIDE 0.9 % IV SOLN
INTRAVENOUS | Status: DC
  Administered 2019-01-13: 15:00:00 via INTRAVENOUS

## 2019-01-13 MED ORDER — DOCUSATE SODIUM 100 MG PO CAPS
100.0000 mg | ORAL_CAPSULE | Freq: Two times a day (BID) | ORAL | Status: DC
  Administered 2019-01-13 – 2019-01-14 (×2): 100 mg via ORAL
  Filled 2019-01-13 (×2): qty 1

## 2019-01-13 MED ORDER — BUPIVACAINE IN DEXTROSE 0.75-8.25 % IT SOLN
INTRATHECAL | Status: DC | PRN
  Administered 2019-01-13: 15 mg via INTRATHECAL

## 2019-01-13 MED ORDER — LISINOPRIL-HYDROCHLOROTHIAZIDE 20-25 MG PO TABS: 1.0000 | ORAL_TABLET | Freq: Every day | ORAL | Status: DC

## 2019-01-13 MED ORDER — MIDAZOLAM HCL 2 MG/2ML IJ SOLN
1.0000 mg | INTRAMUSCULAR | Status: DC
  Administered 2019-01-13: 1 mg via INTRAVENOUS
  Administered 2019-01-13: 0.5 mg via INTRAVENOUS
  Administered 2019-01-13: 1 mg via INTRAVENOUS
  Filled 2019-01-13: qty 2

## 2019-01-13 MED ORDER — PANTOPRAZOLE SODIUM 40 MG PO TBEC
40.0000 mg | DELAYED_RELEASE_TABLET | Freq: Every day | ORAL | Status: DC
  Filled 2019-01-13: qty 1

## 2019-01-13 MED ORDER — ZOLPIDEM TARTRATE 5 MG PO TABS: 5.0000 mg | ORAL_TABLET | Freq: Every evening | ORAL | Status: DC | PRN

## 2019-01-13 MED ORDER — TRANEXAMIC ACID-NACL 1000-0.7 MG/100ML-% IV SOLN
1000.0000 mg | INTRAVENOUS | Status: AC
  Administered 2019-01-13: 1000 mg via INTRAVENOUS
  Filled 2019-01-13: qty 100

## 2019-01-13 MED ORDER — METHOCARBAMOL 500 MG PO TABS: 500.0000 mg | ORAL_TABLET | Freq: Four times a day (QID) | ORAL | Status: DC | PRN

## 2019-01-13 MED ORDER — ACETAMINOPHEN 500 MG PO TABS
1000.0000 mg | ORAL_TABLET | Freq: Once | ORAL | Status: AC
  Administered 2019-01-13: 08:00:00 1000 mg via ORAL
  Filled 2019-01-13: qty 2

## 2019-01-13 MED ORDER — FLEET ENEMA 7-19 GM/118ML RE ENEM: 1.0000 | ENEMA | Freq: Once | RECTAL | Status: DC | PRN

## 2019-01-13 MED ORDER — SODIUM CHLORIDE 0.9 % IR SOLN
Status: DC | PRN
  Administered 2019-01-13: 1000 mL

## 2019-01-13 MED ORDER — 0.9 % SODIUM CHLORIDE (POUR BTL) OPTIME
TOPICAL | Status: DC | PRN
  Administered 2019-01-13: 1000 mL

## 2019-01-13 MED ORDER — LIDOCAINE HCL (CARDIAC) PF 100 MG/5ML IV SOSY
PREFILLED_SYRINGE | INTRAVENOUS | Status: DC | PRN
  Administered 2019-01-13: 20 mg via INTRAVENOUS

## 2019-01-13 MED ORDER — ACETAMINOPHEN 500 MG PO TABS
1000.0000 mg | ORAL_TABLET | Freq: Four times a day (QID) | ORAL | Status: AC
  Administered 2019-01-13 – 2019-01-14 (×4): 1000 mg via ORAL
  Filled 2019-01-13 (×4): qty 2

## 2019-01-13 MED ORDER — ONDANSETRON HCL 4 MG PO TABS: 4.0000 mg | ORAL_TABLET | Freq: Four times a day (QID) | ORAL | Status: DC | PRN

## 2019-01-13 MED ORDER — HYDROCHLOROTHIAZIDE 25 MG PO TABS
25.0000 mg | ORAL_TABLET | Freq: Every day | ORAL | Status: DC
  Administered 2019-01-13 – 2019-01-14 (×2): 25 mg via ORAL
  Filled 2019-01-13 (×2): qty 1

## 2019-01-13 MED ORDER — SODIUM CHLORIDE (PF) 0.9 % IJ SOLN
INTRAMUSCULAR | Status: AC
  Filled 2019-01-13: qty 20

## 2019-01-13 MED ORDER — DEXAMETHASONE SODIUM PHOSPHATE 10 MG/ML IJ SOLN
8.0000 mg | Freq: Once | INTRAMUSCULAR | Status: AC
  Administered 2019-01-13: 8 mg via INTRAVENOUS

## 2019-01-13 SURGICAL SUPPLY — 59 items
ARTISURF 10M PLY L 6-9EF KNEE (Knees) ×2 IMPLANT
BAG ZIPLOCK 12X15 (MISCELLANEOUS) ×3 IMPLANT
BLADE SAGITTAL 13X1.27X60 (BLADE) ×2 IMPLANT
BLADE SAGITTAL 13X1.27X60MM (BLADE) ×1
BLADE SAW SGTL 83.5X18.5 (BLADE) ×3 IMPLANT
BLADE SURG 15 STRL LF DISP TIS (BLADE) ×1 IMPLANT
BLADE SURG 15 STRL SS (BLADE) ×2
BLADE SURG SZ10 CARB STEEL (BLADE) ×6 IMPLANT
BNDG ELASTIC 6X5.8 VLCR STR LF (GAUZE/BANDAGES/DRESSINGS) ×3 IMPLANT
BOWL SMART MIX CTS (DISPOSABLE) ×3 IMPLANT
CEMENT BONE SIMPLEX SPEEDSET (Cement) ×6 IMPLANT
CLOSURE WOUND 1/2 X4 (GAUZE/BANDAGES/DRESSINGS) ×2
COVER SURGICAL LIGHT HANDLE (MISCELLANEOUS) ×3 IMPLANT
COVER WAND RF STERILE (DRAPES) IMPLANT
CUFF TOURN SGL QUICK 34 (TOURNIQUET CUFF) ×2
CUFF TRNQT CYL 34X4.125X (TOURNIQUET CUFF) ×1 IMPLANT
DECANTER SPIKE VIAL GLASS SM (MISCELLANEOUS) ×6 IMPLANT
DRAPE INCISE IOBAN 66X45 STRL (DRAPES) ×6 IMPLANT
DRAPE U-SHAPE 47X51 STRL (DRAPES) ×3 IMPLANT
DRILL PIN HEADLESS TROCAR 3X75 (PIN) ×2 IMPLANT
DRSG AQUACEL AG ADV 3.5X10 (GAUZE/BANDAGES/DRESSINGS) ×3 IMPLANT
DURAPREP 26ML APPLICATOR (WOUND CARE) ×6 IMPLANT
ELECT REM PT RETURN 15FT ADLT (MISCELLANEOUS) ×3 IMPLANT
FEMUR  CMT CCR STD SZ9 L KNEE (Knees) ×2 IMPLANT
FEMUR CMT CCR STD SZ9 L KNEE (Knees) ×1 IMPLANT
FEMUR CMTD CCR STD SZ9 L KNEE (Knees) IMPLANT
GLOVE BIOGEL M STRL SZ7.5 (GLOVE) ×1 IMPLANT
GLOVE BIOGEL PI IND STRL 7.5 (GLOVE) ×1 IMPLANT
GLOVE BIOGEL PI IND STRL 8.5 (GLOVE) ×2 IMPLANT
GLOVE BIOGEL PI INDICATOR 7.5 (GLOVE)
GLOVE BIOGEL PI INDICATOR 8.5 (GLOVE) ×4
GLOVE SURG ORTHO 8.0 STRL STRW (GLOVE) ×9 IMPLANT
GOWN STRL REUS W/ TWL XL LVL3 (GOWN DISPOSABLE) ×2 IMPLANT
GOWN STRL REUS W/TWL XL LVL3 (GOWN DISPOSABLE) ×4
HANDPIECE INTERPULSE COAX TIP (DISPOSABLE) ×2
HOLDER FOLEY CATH W/STRAP (MISCELLANEOUS) ×3 IMPLANT
HOOD PEEL AWAY FLYTE STAYCOOL (MISCELLANEOUS) ×9 IMPLANT
KIT TURNOVER KIT A (KITS) IMPLANT
MANIFOLD NEPTUNE II (INSTRUMENTS) ×3 IMPLANT
NEEDLE HYPO 22GX1.5 SAFETY (NEEDLE) ×3 IMPLANT
NS IRRIG 1000ML POUR BTL (IV SOLUTION) ×3 IMPLANT
PACK TOTAL KNEE CUSTOM (KITS) ×3 IMPLANT
PROTECTOR NERVE ULNAR (MISCELLANEOUS) ×3 IMPLANT
SET HNDPC FAN SPRY TIP SCT (DISPOSABLE) ×1 IMPLANT
STEM POLY PAT PLY 35M KNEE (Knees) ×2 IMPLANT
STEM TIBIA 5 DEG SZ E L KNEE (Knees) IMPLANT
STRIP CLOSURE SKIN 1/2X4 (GAUZE/BANDAGES/DRESSINGS) ×3 IMPLANT
SUT BONE WAX W31G (SUTURE) ×3 IMPLANT
SUT MNCRL AB 3-0 PS2 18 (SUTURE) ×3 IMPLANT
SUT STRATAFIX 0 PDS 27 VIOLET (SUTURE) ×3
SUT STRATAFIX PDS+ 0 24IN (SUTURE) ×3 IMPLANT
SUT VIC AB 1 CT1 36 (SUTURE) ×3 IMPLANT
SUTURE STRATFX 0 PDS 27 VIOLET (SUTURE) ×1 IMPLANT
SYR CONTROL 10ML LL (SYRINGE) ×6 IMPLANT
TIBIA STEM 5 DEG SZ E L KNEE (Knees) ×3 IMPLANT
TRAY FOLEY MTR SLVR 16FR STAT (SET/KITS/TRAYS/PACK) ×3 IMPLANT
WATER STERILE IRR 1000ML POUR (IV SOLUTION) ×6 IMPLANT
WRAP KNEE MAXI GEL POST OP (GAUZE/BANDAGES/DRESSINGS) ×3 IMPLANT
YANKAUER SUCT BULB TIP 10FT TU (MISCELLANEOUS) ×3 IMPLANT

## 2019-01-13 NOTE — H&P (Signed)
Frederick Carter. MRN:  [id number] DOB/SEX:  06-07-1950/male  CHIEF COMPLAINT:  Painful left Knee  HISTORY: Patient is a 68 y.o. male presented with a history of pain in the left knee. Onset of symptoms was gradual starting a few years ago with gradually worsening course since that time. Patient has been treated conservatively with over-the-counter NSAIDs and activity modification. Patient currently rates pain in the knee at 10 out of 10 with activity. There is pain at night.  PAST MEDICAL HISTORY: Patient Active Problem List   Diagnosis Date Noted  . 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 Essential hypertension 04/08/2018  . SOB (shortness of breath) 04/07/2018  . Genetic testing 01/11/2017  . Malignant neoplasm of prostate ([place]) 01/01/2017   Past Medical History:  Diagnosis Date  . Arthritis   . Cataract   . Dyspnea 04/2018  . Hypercholesteremia   . Hypertension   . Lower extremity edema   . Morbid obesity ([place])   . Prostate cancer [facility])    Past Surgical History:  Procedure Laterality Date  . CATARACT EXTRACTION, BILATERAL  2015  . KNEE ARTHROSCOPY Left 2017  . PROSTATE BIOPSY    . SHOULDER SURGERY Left   . TRANSURETHRAL RESECTION OF PROSTATE  2014     MEDICATIONS:   No medications prior to admission.    ALLERGIES:  No Known Allergies  REVIEW OF SYSTEMS:  A comprehensive review of systems was negative except for: Musculoskeletal: positive for arthralgias and bone pain   FAMILY HISTORY:   Family History  Problem Relation Age of Onset  . Arthritis Mother   . Cancer Brother        Prostate  . Cancer Sister        Brain tumor  . Cancer Paternal Uncle 11       Prostate  . Cancer Brother        Lung and Brain    SOCIAL HISTORY:   Social History   Tobacco Use  . Smoking status: Never Smoker  . Smokeless tobacco: Never Used  Substance Use Topics  . Alcohol use: Yes    Comment: occasional     EXAMINATION:  Vital signs in last 24 hours:     There were no vitals taken for this visit.  General Appearance:    Alert, cooperative, no distress, appears stated age  Head:    Normocephalic, without obvious abnormality, atraumatic  Eyes:    PERRL, conjunctiva/corneas clear, EOM's intact, fundi    benign, both eyes       Ears:    Normal TM's and external ear canals, both ears  Nose:   Nares normal, septum midline, mucosa normal, no drainage    or sinus tenderness  Throat:   Lips, mucosa, and tongue normal; teeth and gums normal  Neck:   Supple, symmetrical, trachea midline, no adenopathy;       thyroid:  No enlargement/tenderness/nodules; no carotid   bruit or JVD  Back:     Symmetric, no curvature, ROM normal, no CVA tenderness  Lungs:     Clear to auscultation bilaterally, respirations unlabored  Chest wall:    No tenderness or deformity  Heart:    Regular rate and rhythm, S1 and S2 normal, no murmur, rub   or gallop  Abdomen:     Soft, non-tender, bowel sounds active all four quadrants,    no masses, no organomegaly  Genitalia:    Normal male without lesion, discharge or tenderness  Rectal:    Normal  tone, normal prostate, no masses or tenderness;   guaiac negative stool  Extremities:   Extremities normal, atraumatic, no cyanosis or edema  Pulses:   2+ and symmetric all extremities  Skin:   Skin color, texture, turgor normal, no rashes or lesions  Lymph nodes:   Cervical, supraclavicular, and axillary nodes normal  Neurologic:   CNII-XII intact. Normal strength, sensation and reflexes      throughout    Musculoskeletal:  ROM 0-120, Ligaments intact,  Imaging Review Plain radiographs demonstrate severe degenerative joint disease of the left knee. The overall alignment is neutral. The bone quality appears to be good for age and reported activity level.  Assessment/Plan: Primary osteoarthritis, left knee   The patient history, physical examination and imaging studies are consistent with advanced degenerative joint disease  of the left knee. The patient has failed conservative treatment.  The clearance notes were reviewed.  After discussion with the patient it was felt that Total Knee Replacement was indicated. The procedure,  risks, and benefits of total knee arthroplasty were presented and reviewed. The risks including but not limited to aseptic loosening, infection, blood clots, vascular injury, stiffness, patella tracking problems complications among others were discussed. The patient acknowledged the explanation, agreed to proceed with the plan.  Preoperative templating of the joint replacement has been completed, documented, and submitted to the Operating Room personnel in order to optimize intra-operative equipment management.    Patient's anticipated LOS is less than 2 midnights, meeting these requirements: - Lives within 1 hour of care - Has a competent adult at home to recover with post-op recover - NO history of  - Chronic pain requiring opiods  - Diabetes  - Coronary Artery Disease  - Heart failure  - Heart attack  - Stroke  - DVT/VTE  - Cardiac arrhythmia  - Respiratory Failure/COPD  - Renal failure  - Anemia  - Advanced Liver disease       Donia Ast 01/13/2019, 6:21 AM

## 2019-01-13 NOTE — Evaluation (Signed)
Physical Therapy Evaluation Patient Details Name: Frederick Carter. MRN: [id number] DOB: [birth year] Today's Date: 01/13/2019   History of Present Illness  Patient is 68 y.o. male s/p Lt TKA with PMH significant for prostat cancer, HTN, arthritis, and Lt shoulder surgery.  Clinical Impression  Frederick Carter. is a 68 y.o. male POD 0 s/p Lt TKA. Patient reports independence with mobility at baseline with no device. Patient is now limited by functional impairments (see PT problem list below) and requires Min assist/guard for transfers and gait with RW. Patient was able to ambulate ~100 feet with RW and min guard with no increase in pain. Patient instructed in exercise to facilitate ROM and circulation to manage edema. Patient will benefit from continued skilled PT interventions to address impairments and progress towards PLOF. Acute PT will follow to progress mobility and stair training in preparation for safe discharge home.     Follow Up Recommendations Follow surgeon's recommendation for DC plan and follow-up therapies    Equipment Recommendations  None recommended by PT    Recommendations for Other Services       Precautions / Restrictions Precautions Precautions: Fall Restrictions Weight Bearing Restrictions: No      Mobility  Bed Mobility Overal bed mobility: Needs Assistance Bed Mobility: Supine to Sit     Supine to sit: Supervision;HOB elevated     General bed mobility comments: supervision for safety and assist with line management, HOB slightly elevated, pt using bed rails  Transfers Overall transfer level: Needs assistance Equipment used: Rolling walker (2 wheeled) Transfers: Sit to/from Stand Sit to Stand: Min guard;Min assist         General transfer comment: cues for safe hand placement, min assist for power up and in guard upon rising to steady for balance  Ambulation/Gait Ambulation/Gait assistance: Min guard Gait Distance (Feet): 100 Feet Assistive  device: Rolling walker (2 wheeled) Gait Pattern/deviations: Step-through pattern;Decreased step length - right;Decreased step length - left;Decreased stance time - left;Decreased weight shift to left;Wide base of support Gait velocity: decreased   General Gait Details: cues or safe hand placement and to maintain safe proximety to RW throughout, pt withgood step through pattern and pushing RW like shopping cart" for smooth gait pattern; no overt LOB during gait or while turning  Stairs            Wheelchair Mobility    Modified Rankin (Stroke Patients Only)       Balance Overall balance assessment: Needs assistance Sitting-balance support: Feet supported;No upper extremity supported Sitting balance-Leahy Scale: Good     Standing balance support: During functional activity;Bilateral upper extremity supported Standing balance-Leahy Scale: Fair Standing balance comment: p able to stand without UE support, UE's required for dynamic standing and gait              Pertinent Vitals/Pain Pain Assessment: No/denies pain Pain Location: Lt knee Pain Intervention(s): Monitored during session;Limited activity within patient's tolerance    Home Living Family/patient expects to be discharged to:: Private residence Living Arrangements: Alone Available Help at Discharge: Friend(s);Other (Comment);Available 24 hours/day(pt's "lady friend" is going to stay with him) Type of Home: House Home Access: Stairs to enter Entrance Stairs-Rails: Left Entrance Stairs-Number of Steps: 4 Home Layout: One level Home Equipment: Walker - 2 wheels;Cane - single point;Crutches Additional Comments: pt states he has a seat in the bathroom that he could use as a shower seat but that he doesn't have an official shower chair; pt's girlfriend is going to stay with  him to help him during his recovery    Prior Function Level of Independence: Independent          Hand Dominance        Extremity/Trunk  Assessment   Upper Extremity Assessment Upper Extremity Assessment: Overall WFL for tasks assessed    Lower Extremity Assessment Lower Extremity Assessment: LLE deficits/detail LLE Deficits / Details: pt able to maintain good quad contraction with SLR, no evidence of buckling with WB    Cervical / Trunk Assessment Cervical / Trunk Assessment: Normal  Communication   Communication: No difficulties  Cognition Arousal/Alertness: Awake/alert Behavior During Therapy: WFL for tasks assessed/performed Overall Cognitive Status: Within Functional Limits for tasks assessed             General Comments      Exercises Total Joint Exercises Ankle Circles/Pumps: AROM;10 reps;Seated;Both Quad Sets: AROM;5 reps;Supine;Seated;Left(2 sets (1x5 supine, 1x5 seated)) Heel Slides: Left;Seated;AAROM(LE elevated with gait belt)   Assessment/Plan    PT Assessment Patient needs continued PT services  PT Problem List Decreased strength;Decreased balance;Decreased range of motion;Decreased mobility;Decreased activity tolerance;Decreased knowledge of use of DME       PT Treatment Interventions      PT Goals (Current goals can be found in the Care Plan section)  Acute Rehab PT Goals Patient Stated Goal: pt did not specify goals, discussed returning home and being able to walk with knee not hurting PT Goal Formulation: With patient Time For Goal Achievement: 01/20/19 Potential to Achieve Goals: Good    Frequency 7X/week    AM-PAC PT "6 Clicks" Mobility  Outcome Measure Help needed turning from your back to your side while in a flat bed without using bedrails?: A Little Help needed moving from lying on your back to sitting on the side of a flat bed without using bedrails?: A Little Help needed moving to and from a bed to a chair (including a wheelchair)?: A Little Help needed standing up from a chair using your arms (e.g., wheelchair or bedside chair)?: A Little Help needed to walk in  hospital room?: A Little Help needed climbing 3-5 steps with a railing? : A Little 6 Click Score: 18    End of Session Equipment Utilized During Treatment: Gait belt Activity Tolerance: Patient tolerated treatment well Patient left: in chair;with call bell/phone within reach;with chair alarm set;with family/visitor present Nurse Communication: Mobility status PT Visit Diagnosis: Other abnormalities of gait and mobility (R26.89);Difficulty in walking, not elsewhere classified (R26.2);Muscle weakness (generalized) (M62.81)    Time: 1542-1600 PT Time Calculation (min) (ACUTE ONLY): 18 min   Charges:   PT Evaluation $PT Eval Low Complexity: 1 Low          Kipp Brood, PT, DPT, [facility] Physical Therapist with [facility]  01/13/2019 4:23 PM

## 2019-01-13 NOTE — Op Note (Signed)
TOTAL KNEE REPLACEMENT OPERATIVE NOTE:  01/13/2019  3:27 PM  PATIENT:  Frederick Carter.  68 y.o. male  PRE-OPERATIVE DIAGNOSIS:  LT. KNEE OSTEOARTHRITIS  POST-OPERATIVE DIAGNOSIS:  LT. KNEE OSTEOARTHRITIS  PROCEDURE:  Procedure(s): TOTAL KNEE ARTHROPLASTY  SURGEON:  Surgeon(s): Vickey Huger, MD  PHYSICIAN ASSISTANT: Carlyon Shadow, PA-C  ANESTHESIA:   spinal  SPECIMEN: None  COUNTS:  Correct  TOURNIQUET:   Total Tourniquet Time Documented: Thigh (Left) - 44 minutes Total: Thigh (Left) - 44 minutes   DICTATION:  Indication for procedure:    The patient is a 68 y.o. male who has failed conservative treatment for LT. KNEE OSTEOARTHRITIS.  Informed consent was obtained prior to anesthesia. The risks versus benefits of the operation were explain and in a way the patient can, and did, understand.    Description of procedure:     The patient was taken to the operating room and placed under anesthesia.  The patient was positioned in the usual fashion taking care that all body parts were adequately padded and/or protected.  A tourniquet was applied and the leg prepped and draped in the usual sterile fashion.  The extremity was exsanguinated with the esmarch and tourniquet inflated to 350 mmHg.  Pre-operative range of motion was normal.  A midline incision approximately 6-7 inches long was made with a #10 blade.  A new blade was used to make a parapatellar arthrotomy going 2-3 cm into the quadriceps tendon, over the patella, and alongside the medial aspect of the patellar tendon.  A synovectomy was then performed with the #10 blade and forceps. I then elevated the deep MCL off the medial tibial metaphysis subperiosteally around to the semimembranosus attachment.    I everted the patella and used calipers to measure patellar thickness.  I used the reamer to ream down to appropriate thickness to recreate the native thickness.  I then removed excess bone with the rongeur and sagittal  saw.  I used the appropriately sized template and drilled the three lug holes.  I then put the trial in place and measured the thickness with the calipers to ensure recreation of the native thickness.  The trial was then removed and the patella subluxed and the knee brought into flexion.  A homan retractor was place to retract and protect the patella and lateral structures.  A Z-retractor was place medially to protect the medial structures.  The extra-medullary alignment system was used to make cut the tibial articular surface perpendicular to the anamotic axis of the tibia and in 3 degrees of posterior slope.  The cut surface and alignment jig was removed.  I then used the intramedullary alignment guide to make a valgus cut on the distal femur.  I then marked out the epicondylar axis on the distal femur. I then used the anterior referencing sizer and measured the femur to be a size 9.  The 4-In-1 cutting block was screwed into place in external rotation matching the posterior condylar angle, making our cuts perpendicular to the epicondylar axis.  Anterior, posterior and chamfer cuts were made with the sagittal saw.  The cutting block and cut pieces were removed.  A lamina spreader was placed in 90 degrees of flexion.  The ACL, PCL, menisci, and posterior condylar osteophytes were removed.  A 10 mm spacer blocked was found to offer good flexion and extension gap balance after minimal in degree releasing.   The scoop retractor was then placed and the femoral finishing block was pinned in place.  The small sagittal saw was used as well as the lug drill to finish the femur.  The block and cut surfaces were removed and the medullary canal hole filled with autograft bone from the cut pieces.  The tibia was delivered forward in deep flexion and external rotation.  A size E tray was selected and pinned into place centered on the medial 1/3 of the tibial tubercle.  The reamer and keel was used to prepare the tibia  through the tray.    I then trialed with the size 9 femur, size E tibia, a 10 mm insert and the 35 patella.  I had excellent flexion/extension gap balance, excellent patella tracking.  Flexion was full and beyond 120 degrees; extension was zero.  These components were chosen and the staff opened them to me on the back table while the knee was lavaged copiously and the cement mixed.  The soft tissue was infiltrated with 60cc of exparel 1.3% through a 21 gauge needle.  I cemented in the components and removed all excess cement.  The polyethylene tibial component was snapped into place and the knee placed in extension while cement was hardening.  The capsule was infilltrated with a 60cc exparel/marcaine/saline mixture.   Once the cement was hard, the tourniquet was let down.  Hemostasis was obtained.  The arthrotomy was closed using a #1 stratofix running suture.  The deep soft tissues were closed with #0 vicryls and the subcuticular layer closed with #2-0 vicryl.  The skin was reapproximated and closed with 3.0 Monocryl.  The wound was covered with steristrips, aquacel dressing, and a TED stocking.   The patient was then awakened, extubated, and taken to the recovery room in stable condition.  BLOOD LOSS:  0000000 COMPLICATIONS:  None.  PLAN OF CARE: Admit for overnight observation  PATIENT DISPOSITION:  PACU - hemodynamically stable.    Please fax a copy of this op note to my office at [phone] (please only include page 1 and 2 of the Case Information op note)

## 2019-01-13 NOTE — Anesthesia Postprocedure Evaluation (Signed)
Anesthesia Post Note  Patient: Lamine Caravantes.  Procedure(s) Performed: TOTAL KNEE ARTHROPLASTY (Left Knee)     Patient location during evaluation: PACU Anesthesia Type: Regional and Spinal Level of consciousness: oriented and awake and alert Pain management: pain level controlled Vital Signs Assessment: post-procedure vital signs reviewed and stable Respiratory status: spontaneous breathing, respiratory function stable and patient connected to nasal cannula oxygen Cardiovascular status: blood pressure returned to baseline and stable Postop Assessment: no headache, no backache, no apparent nausea or vomiting and spinal receding Anesthetic complications: no    Last Vitals:  Vitals:   01/13/19 1601 01/13/19 1834  BP: (!) 146/84 134/75  Pulse: 74 73  Resp: 16 16  Temp: 36.7 C 36.5 C  SpO2: 96% 95%    Last Pain:  Vitals:   01/13/19 1834  TempSrc: Oral  PainSc:                  Karyl Kinnier [facility]

## 2019-01-13 NOTE — Transfer of Care (Signed)
Immediate Anesthesia Transfer of Care Note  Patient: Frederick Carter.  Procedure(s) Performed: TOTAL KNEE ARTHROPLASTY (Left Knee)  Patient Location: PACU  Anesthesia Type:Spinal and MAC combined with regional for post-op pain  Level of Consciousness: awake, alert , oriented and patient cooperative  Airway & Oxygen Therapy: Patient Spontanous Breathing and Patient connected to face mask oxygen  Post-op Assessment: Report given to RN and Post -op Vital signs reviewed and stable  Post vital signs: Reviewed and stable  Last Vitals:  Vitals Value Taken Time  BP 114/71 01/13/19 1108  Temp    Pulse 58 01/13/19 1110  Resp 10 01/13/19 1110  SpO2 100 % 01/13/19 1110  Vitals shown include unvalidated device data.  Last Pain:  Vitals:   01/13/19 0904  TempSrc:   PainSc: 0-No pain         Complications: No apparent anesthesia complications

## 2019-01-13 NOTE — Anesthesia Procedure Notes (Signed)
Spinal  Start time: 01/13/2019 9:29 AM End time: 01/13/2019 9:34 AM Staffing Resident/CRNA: Garrel Ridgel, CRNA Performed: resident/CRNA  Preanesthetic Checklist Completed: patient identified, site marked, surgical consent, pre-op evaluation, IV checked, risks and benefits discussed and monitors and equipment checked Spinal Block Patient position: sitting Prep: Betadine Patient monitoring: heart rate, continuous pulse ox, blood pressure and cardiac monitor Approach: midline Location: L3-4 Injection technique: single-shot Needle Needle type: Pencan  Needle gauge: 24 G Needle length: 9 cm Needle insertion depth: 5 cm Assessment Sensory level: T8

## 2019-01-13 NOTE — Plan of Care (Signed)
Plan of care 

## 2019-01-13 NOTE — Anesthesia Procedure Notes (Signed)
Anesthesia Regional Block: Adductor canal block   Pre-Anesthetic Checklist: ,, timeout performed, Correct Patient, Correct Site, Correct Laterality, Correct Procedure,, site marked, risks and benefits discussed, Surgical consent,  Pre-op evaluation,  At surgeon's request and post-op pain management  Laterality: Left  Prep: chloraprep       Needles:  Injection technique: Single-shot  Needle Type: Echogenic Stimulator Needle     Needle Length: 10cm  Needle Gauge: 21     Additional Needles:   Procedures:,,,, ultrasound used (permanent image in chart),,,,  Narrative:  Start time: 01/13/2019 8:40 AM End time: 01/13/2019 8:50 AM Injection made incrementally with aspirations every 5 mL.  Performed by: Personally  Anesthesiologist: Murvin Natal, MD  Additional Notes: Functioning IV was confirmed and monitors were applied. A time-out was performed. Hand hygiene and sterile gloves were used. The thigh was placed in a frog-leg position and prepped in a sterile fashion. A 77mm 21ga Arrow echogenic stimulator needle was placed using ultrasound guidance.  Negative aspiration and negative test dose prior to incremental administration of local anesthetic. The patient tolerated the procedure well.

## 2019-01-14 ENCOUNTER — Encounter (HOSPITAL_COMMUNITY): Payer: Self-pay | Admitting: Orthopedic Surgery

## 2019-01-14 DIAGNOSIS — I10 Essential (primary) hypertension: Secondary | ICD-10-CM | POA: Diagnosis not present

## 2019-01-14 DIAGNOSIS — M25762 Osteophyte, left knee: Secondary | ICD-10-CM | POA: Diagnosis not present

## 2019-01-14 DIAGNOSIS — Z6841 Body Mass Index (BMI) 40.0 and over, adult: Secondary | ICD-10-CM | POA: Diagnosis not present

## 2019-01-14 DIAGNOSIS — M1712 Unilateral primary osteoarthritis, left knee: Secondary | ICD-10-CM | POA: Diagnosis not present

## 2019-01-14 DIAGNOSIS — Z8546 Personal history of malignant neoplasm of prostate: Secondary | ICD-10-CM | POA: Diagnosis not present

## 2019-01-14 LAB — CBC
HCT: 38.6 % — ABNORMAL LOW (ref 39.0–52.0)
Hemoglobin: 12.7 g/dL — ABNORMAL LOW (ref 13.0–17.0)
MCH: 30.6 pg (ref 26.0–34.0)
MCHC: 32.9 g/dL (ref 30.0–36.0)
MCV: 93 fL (ref 80.0–100.0)
Platelets: 260 10*3/uL (ref 150–400)
RBC: 4.15 MIL/uL — ABNORMAL LOW (ref 4.22–5.81)
RDW: 13.6 % (ref 11.5–15.5)
WBC: 13.5 10*3/uL — ABNORMAL HIGH (ref 4.0–10.5)
nRBC: 0 % (ref 0.0–0.2)

## 2019-01-14 LAB — BASIC METABOLIC PANEL
Anion gap: 9 (ref 5–15)
BUN: 16 mg/dL (ref 8–23)
CO2: 25 mmol/L (ref 22–32)
Calcium: 8.7 mg/dL — ABNORMAL LOW (ref 8.9–10.3)
Chloride: 107 mmol/L (ref 98–111)
Creatinine, Ser: 1.06 mg/dL (ref 0.61–1.24)
GFR calc Af Amer: >60 mL/min (ref >60)
GFR calc non Af Amer: >60 mL/min (ref >60)
Glucose, Bld: 129 mg/dL — ABNORMAL HIGH (ref 70–99)
Potassium: 4.2 mmol/L (ref 3.5–5.1)
Sodium: 141 mmol/L (ref 135–145)

## 2019-01-14 MED ORDER — ASPIRIN 325 MG PO TBEC: 325.0000 mg | DELAYED_RELEASE_TABLET | Freq: Two times a day (BID) | ORAL | 0 refills | Status: DC

## 2019-01-14 MED ORDER — OXYCODONE HCL 5 MG PO TABS: 5.0000 mg | ORAL_TABLET | Freq: Four times a day (QID) | ORAL | 0 refills | Status: DC | PRN

## 2019-01-14 MED ORDER — METHOCARBAMOL 500 MG PO TABS: 500.0000 mg | ORAL_TABLET | Freq: Four times a day (QID) | ORAL | 0 refills | Status: DC | PRN

## 2019-01-14 NOTE — Progress Notes (Signed)
SPORTS MEDICINE AND JOINT REPLACEMENT  Lara Mulch, MD    Carlyon Shadow, PA-C [address], [place], [place]  [address]                             [phone]   PROGRESS NOTE  Subjective:  negative for Chest Pain  negative for Shortness of Breath  negative for Nausea/Vomiting   negative for Calf Pain  negative for Bowel Movement   Tolerating Diet: yes         Patient reports pain as 3 on 0-10 scale.    Objective: Vital signs in last 24 hours:    Patient Vitals for the past 24 hrs:  BP Temp Temp src Pulse Resp SpO2  01/14/19 0532 (!) 140/99 97.6 F (36.4 C) - (!) 57 18 97 %  01/14/19 0126 113/72 98.6 F (37 C) Oral 70 18 97 %  01/13/19 2046 112/80 98 F (36.7 C) Oral 63 18 95 %  01/13/19 1834 134/75 97.7 F (36.5 C) Oral 73 16 95 %  01/13/19 1601 (!) 146/84 98.1 F (36.7 C) Oral 74 16 96 %  01/13/19 1517 (!) 142/88 97.6 F (36.4 C) - 79 16 96 %  01/13/19 1401 124/78 98.8 F (37.1 C) Oral 67 17 96 %  01/13/19 1301 128/87 97.8 F (36.6 C) Oral 65 18 98 %  01/13/19 1245 127/82 (!) 97.5 F (36.4 C) - 65 11 98 %  01/13/19 1230 126/79 (!) 97.5 F (36.4 C) - 62 11 94 %  01/13/19 1215 120/79 - - 61 11 96 %  01/13/19 1200 (!) 144/83 - - 65 17 94 %  01/13/19 1145 109/78 (!) 97.5 F (36.4 C) - 60 13 98 %  01/13/19 1130 (!) 104/93 - - 64 15 95 %  01/13/19 1115 104/76 - - (!) 58 17 95 %  01/13/19 1108 114/71 (!) 97.5 F (36.4 C) - 68 13 100 %  01/13/19 0909 134/83 - - 60 12 99 %  01/13/19 0904 132/86 - - 63 12 96 %  01/13/19 0859 (!) 143/93 - - 68 (!) 8 98 %  01/13/19 0854 (!) 146/83 - - 64 15 96 %  01/13/19 0849 139/82 - - 64 16 98 %  01/13/19 0847 136/89 - - 68 15 100 %  01/13/19 0844 139/85 - - 65 13 99 %    @flow {1959:LAST@   Intake/Output from previous day:   09/21 0701 - 09/22 0700 In: 4191.5 [P.O.:1020; I.V.:2721.5] Out: 4105 [Urine:4080]   Intake/Output this shift:   No intake/output data recorded.   Intake/Output      09/21 0701 - 09/22  0700 09/22 0701 - 09/23 0700   P.O. 1020    I.V. (mL/kg) 2721.5 (23.4)    IV Piggyback 450    Total Intake(mL/kg) 4191.5 (36.1)    Urine (mL/kg/hr) 4080 (1.5)    Emesis/NG output 0    Stool 0    Blood 25    Total Output 4105    Net +86.5         Stool Occurrence 0 x    Emesis Occurrence 0 x       LABORATORY DATA: Recent Labs    01/07/19 0841 01/13/19 0737 01/14/19 0232  WBC 6.4 5.7 13.5*  HGB 14.7 13.9 12.7*  HCT 45.1 42.1 38.6*  PLT 268 258 260   Recent Labs    01/07/19 0841 01/13/19 0737 01/14/19 0232  NA 144  141 141  K 3.9 3.8 4.2  CL 104 108 107  CO2 29 22 25   BUN 13 18 16   CREATININE 1.05 0.90 1.06  GLUCOSE 105* 100* 129*  CALCIUM 9.2 8.9 8.7*   No results found for: INR, PROTIME  Examination:  General appearance: alert, cooperative and no distress Extremities: extremities normal, atraumatic, no cyanosis or edema  Wound Exam: clean, dry, intact   Drainage:  None: wound tissue dry  Motor Exam: Quadriceps and Hamstrings Intact  Sensory Exam: Superficial Peroneal, Deep Peroneal and Tibial normal   Assessment:    1 Day Post-Op  Procedure(s) (LRB): TOTAL KNEE ARTHROPLASTY (Left)  ADDITIONAL DIAGNOSIS:  Active Problems:   S/P total knee replacement     Plan: Physical Therapy as ordered Weight Bearing as Tolerated (WBAT)  DVT Prophylaxis:  Aspirin  DISCHARGE PLAN: Home  Patient doing well, expected D/C home today       Patient's anticipated LOS is less than 2 midnights, meeting these requirements: - Lives within 1 hour of care - Has a competent adult at home to recover with post-op recover - NO history of  - Chronic pain requiring opiods  - Diabetes  - Coronary Artery Disease  - Heart failure  - Heart attack  - Stroke  - DVT/VTE  - Cardiac arrhythmia  - Respiratory Failure/COPD  - Renal failure  - Anemia  - Advanced Liver disease        Donia Ast 01/14/2019, 7:54 AM

## 2019-01-14 NOTE — Plan of Care (Signed)
Continue with current POC

## 2019-01-14 NOTE — Progress Notes (Signed)
Physical Therapy Treatment Patient Details Name: Frederick Carter. MRN: [id number] DOB: 11/20/1950 Today's Date: 01/14/2019    History of Present Illness Patient is 68 y.o. male s/p Lt TKA with PMH significant for prostat cancer, HTN, arthritis, and Lt shoulder surgery.    PT Comments    Progressing well with mobility. Reviewed/practiced exercises, gait training, and stair training. All education completed. Okay to d/c from PT standpoint.    Follow Up Recommendations  Follow surgeon's recommendation for DC plan and follow-up therapies     Equipment Recommendations  None recommended by PT    Recommendations for Other Services       Precautions / Restrictions Precautions Precautions: Fall Restrictions Weight Bearing Restrictions: No Other Position/Activity Restrictions: WBAT    Mobility  Bed Mobility Overal bed mobility: Modified Independent                Transfers Overall transfer level: Needs assistance Equipment used: Rolling walker (2 wheeled) Transfers: Sit to/from Stand Sit to Stand: Supervision         General transfer comment: for safety. VCs hand placement  Ambulation/Gait Ambulation/Gait assistance: Supervision Gait Distance (Feet): 185 Feet Assistive device: Rolling walker (2 wheeled) Gait Pattern/deviations: Step-through pattern;Decreased stride length     General Gait Details: for safety   Stairs Stairs: Yes Stairs assistance: Supervision Stair Management: Sideways;One rail Left Number of Stairs: 2 General stair comments: up and over portable stairs. pt used 2 hands on 1 rail. vcs safety, sequence, technique.   Wheelchair Mobility    Modified Rankin (Stroke Patients Only)       Balance Overall balance assessment: Mild deficits observed, not formally tested                                          Cognition Arousal/Alertness: Awake/alert Behavior During Therapy: WFL for tasks assessed/performed Overall  Cognitive Status: Within Functional Limits for tasks assessed                                        Exercises Total Joint Exercises Ankle Circles/Pumps: AROM;10 reps;Both;Supine Quad Sets: AROM;Supine;Left;10 reps Straight Leg Raises: AROM;Left;10 reps;Supine Long Arc Quad: AROM;Left;10 reps;Seated Knee Flexion: AROM;Left;10 reps;Seated Goniometric ROM: ~0-90    General Comments        Pertinent Vitals/Pain Pain Assessment: 0-10 Pain Score: 5  Pain Location: Lt knee Pain Descriptors / Indicators: Sore Pain Intervention(s): Monitored during session;Ice applied    Home Living                      Prior Function            PT Goals (current goals can now be found in the care plan section) Progress towards PT goals: Progressing toward goals    Frequency    7X/week      PT Plan Current plan remains appropriate    Co-evaluation              AM-PAC PT "6 Clicks" Mobility   Outcome Measure  Help needed turning from your back to your side while in a flat bed without using bedrails?: None Help needed moving from lying on your back to sitting on the side of a flat bed without using bedrails?: None Help needed moving to and from a  bed to a chair (including a wheelchair)?: A Little Help needed standing up from a chair using your arms (e.g., wheelchair or bedside chair)?: A Little Help needed to walk in hospital room?: A Little Help needed climbing 3-5 steps with a railing? : A Little 6 Click Score: 20    End of Session Equipment Utilized During Treatment: Gait belt Activity Tolerance: Patient tolerated treatment well Patient left: in chair;with call bell/phone within reach;with family/visitor present   PT Visit Diagnosis: Other abnormalities of gait and mobility (R26.89);Difficulty in walking, not elsewhere classified (R26.2);Muscle weakness (generalized) (M62.81)     Time: [id number] PT Time Calculation (min) (ACUTE ONLY): 15  min  Charges:  $Gait Training: 8-22 mins                        Weston Anna, PT Acute Rehabilitation Services Pager: [phone] Office: [phone]

## 2019-01-14 NOTE — TOC Transition Note (Signed)
Transition of Care [facility]) - CM/SW Discharge Note   Patient Details  Name: Frederick Carter. MRN: [id number] Date of Birth: 1950/12/15  [facility] CM/SW Contact:  Lia Hopping, LCSW Phone Number: 01/14/2019, 10:25 AM   Clinical Narrative:    Patient arranged to complete Outpatient Therapy in [place]. PT recommendation Home Health, until the patient has his first outpatient appointment arranged.  CSW gave the patient Home Health compare list. Patient prefers [facility].  [place] arranged.  CSW left voicemail for practice manager Dola Factor.     Final next level of care: OP Rehab Barriers to Discharge: No Barriers Identified   Patient Goals and CMS Choice Patient states their goals for this hospitalization and ongoing recovery are:: to go home CMS Medicare.gov Compare Post Acute Care list provided to:: Patient Choice offered to / list presented to : Patient, Spouse  Discharge Placement                       Discharge Plan and Services                          HH Arranged: PT [place] Agency: [facility] [employer] Date [place]: 01/14/19 Time [place]: 1024    Social Determinants of Health (SDOH) Interventions     Readmission Risk Interventions No flowsheet data found.

## 2019-01-19 NOTE — Discharge Summary (Signed)
SPORTS MEDICINE & JOINT REPLACEMENT   Lara Mulch, MD   Carlyon Shadow, PA-C [place], [place], [place]  [address]                             [phone]  PATIENT ID: Frederick Carter.        MRN:  [id number]          DOB/AGE: [birth year] / 68 y.o.    DISCHARGE SUMMARY  ADMISSION DATE:    01/13/2019 DISCHARGE DATE:   01/14/2019   ADMISSION DIAGNOSIS: LT. KNEE OSTEOARTHRITIS    DISCHARGE DIAGNOSIS:  LT. KNEE OSTEOARTHRITIS    ADDITIONAL DIAGNOSIS: Active Problems:   S/P total knee replacement  Past Medical History:  Diagnosis Date  . Arthritis   . Cataract   . Dyspnea 04/2018  . Hypercholesteremia   . Hypertension   . Lower extremity edema   . Morbid obesity ([place])   . Prostate cancer [facility])     PROCEDURE: Procedure(s): TOTAL KNEE ARTHROPLASTY on 01/13/2019  CONSULTS:    HISTORY:  See H&P in chart  HOSPITAL COURSE:  Frederick Carter. is a 68 y.o. admitted on 01/13/2019 and found to have a diagnosis of LT. KNEE OSTEOARTHRITIS.  After appropriate laboratory studies were obtained  they were taken to the operating room on 01/13/2019 and underwent Procedure(s): TOTAL KNEE ARTHROPLASTY.   They were given perioperative antibiotics:  Anti-infectives (From admission, onward)   Start     Dose/Rate Route Frequency Ordered Stop   01/13/19 1600  ceFAZolin (ANCEF) IVPB 2g/100 mL premix     2 g 200 mL/hr over 30 Minutes Intravenous Every 6 hours 01/13/19 1314 01/13/19 2128   01/13/19 0952  ceFAZolin (ANCEF) 2-4 GM/100ML-% IVPB    Note to Pharmacy: Caryl Pina   : cabinet override      01/13/19 0952 01/13/19 0959   01/13/19 0745  ceFAZolin (ANCEF) IVPB 2g/100 mL premix     2 g 200 mL/hr over 30 Minutes Intravenous On call to O.R. 01/13/19 [id number] 01/13/19 1009    .  Patient given tranexamic acid IV or topical and exparel intra-operatively.  Tolerated the procedure well.    POD# 1: Vital signs were stable.  Patient denied Chest pain, shortness of breath, or calf  pain.  Patient was started on Aspirin twice daily at 8am.  Consults to PT, OT, and care management were made.  The patient was weight bearing as tolerated.  CPM was placed on the operative leg 0-90 degrees for 6-8 hours a day. When out of the CPM, patient was placed in the foam block to achieve full extension. Incentive spirometry was taught.  Dressing was changed.       POD #2, Continued  PT for ambulation and exercise program.  IV saline locked.  O2 discontinued.    The remainder of the hospital course was dedicated to ambulation and strengthening.   The patient was discharged on 1 day post op in  Good condition.  Blood products given:none  DIAGNOSTIC STUDIES: Recent vital signs: No data found.     Recent laboratory studies: Recent Labs    01/13/19 0737 01/14/19 0232  WBC 5.7 13.5*  HGB 13.9 12.7*  HCT 42.1 38.6*  PLT 258 260   Recent Labs    01/13/19 0737 01/14/19 0232  NA 141 141  K 3.8 4.2  CL 108 107  CO2 22 25  BUN 18 16  CREATININE 0.90 1.06  GLUCOSE 100* 129*  CALCIUM 8.9 8.7*   No results found for: INR, PROTIME   Recent Radiographic Studies :  No results found.  DISCHARGE INSTRUCTIONS: Discharge Instructions    Call MD / Call 911   Complete by: As directed    If you experience chest pain or shortness of breath, CALL 911 and be transported to the hospital emergency room.  If you develope a fever above 101 F, pus (white drainage) or increased drainage or redness at the wound, or calf pain, call your surgeon's office.   Constipation Prevention   Complete by: As directed    Drink plenty of fluids.  Prune juice may be helpful.  You may use a stool softener, such as Colace (over the counter) 100 mg twice a day.  Use MiraLax (over the counter) for constipation as needed.   Diet - low sodium heart healthy   Complete by: As directed    Discharge instructions   Complete by: As directed    INSTRUCTIONS AFTER JOINT REPLACEMENT   Remove items at home which could  result in a fall. This includes throw rugs or furniture in walking pathways ICE to the affected joint every three hours while awake for 30 minutes at a time, for at least the first 3-5 days, and then as needed for pain and swelling.  Continue to use ice for pain and swelling. You may notice swelling that will progress down to the foot and ankle.  This is normal after surgery.  Elevate your leg when you are not up walking on it.   Continue to use the breathing machine you got in the hospital (incentive spirometer) which will help keep your temperature down.  It is common for your temperature to cycle up and down following surgery, especially at night when you are not up moving around and exerting yourself.  The breathing machine keeps your lungs expanded and your temperature down.   DIET:  As you were doing prior to hospitalization, we recommend a well-balanced diet.  DRESSING / WOUND CARE / SHOWERING  Keep the surgical dressing until follow up.  The dressing is water proof, so you can shower without any extra covering.  IF THE DRESSING FALLS OFF or the wound gets wet inside, change the dressing with sterile gauze.  Please use good hand washing techniques before changing the dressing.  Do not use any lotions or creams on the incision until instructed by your surgeon.    ACTIVITY  Increase activity slowly as tolerated, but follow the weight bearing instructions below.   No driving for 6 weeks or until further direction given by your physician.  You cannot drive while taking narcotics.  No lifting or carrying greater than 10 lbs. until further directed by your surgeon. Avoid periods of inactivity such as sitting longer than an hour when not asleep. This helps prevent blood clots.  You may return to work once you are authorized by your doctor.     WEIGHT BEARING   Weight bearing as tolerated with assist device (walker, cane, etc) as directed, use it as long as suggested by your surgeon or  therapist, typically at least 4-6 weeks.   EXERCISES  Results after joint replacement surgery are often greatly improved when you follow the exercise, range of motion and muscle strengthening exercises prescribed by your doctor. Safety measures are also important to protect the joint from further injury. Any time any of these exercises cause you to have increased pain or swelling, decrease what  you are doing until you are comfortable again and then slowly increase them. If you have problems or questions, call your caregiver or physical therapist for advice.   Rehabilitation is important following a joint replacement. After just a few days of immobilization, the muscles of the leg can become weakened and shrink (atrophy).  These exercises are designed to build up the tone and strength of the thigh and leg muscles and to improve motion. Often times heat used for twenty to thirty minutes before working out will loosen up your tissues and help with improving the range of motion but do not use heat for the first two weeks following surgery (sometimes heat can increase post-operative swelling).   These exercises can be done on a training (exercise) mat, on the floor, on a table or on a bed. Use whatever works the best and is most comfortable for you.    Use music or television while you are exercising so that the exercises are a pleasant break in your day. This will make your life better with the exercises acting as a break in your routine that you can look forward to.   Perform all exercises about fifteen times, three times per day or as directed.  You should exercise both the operative leg and the other leg as well.   Exercises include:   Quad Sets - Tighten up the muscle on the front of the thigh (Quad) and hold for 5-10 seconds.   Straight Leg Raises - With your knee straight (if you were given a brace, keep it on), lift the leg to 60 degrees, hold for 3 seconds, and slowly lower the leg.  Perform this  exercise against resistance later as your leg gets stronger.  Leg Slides: Lying on your back, slowly slide your foot toward your buttocks, bending your knee up off the floor (only go as far as is comfortable). Then slowly slide your foot back down until your leg is flat on the floor again.  Angel Wings: Lying on your back spread your legs to the side as far apart as you can without causing discomfort.  Hamstring Strength:  Lying on your back, push your heel against the floor with your leg straight by tightening up the muscles of your buttocks.  Repeat, but this time bend your knee to a comfortable angle, and push your heel against the floor.  You may put a pillow under the heel to make it more comfortable if necessary.   A rehabilitation program following joint replacement surgery can speed recovery and prevent re-injury in the future due to weakened muscles. Contact your doctor or a physical therapist for more information on knee rehabilitation.    CONSTIPATION  Constipation is defined medically as fewer than three stools per week and severe constipation as less than one stool per week.  Even if you have a regular bowel pattern at home, your normal regimen is likely to be disrupted due to multiple reasons following surgery.  Combination of anesthesia, postoperative narcotics, change in appetite and fluid intake all can affect your bowels.   YOU MUST use at least one of the following options; they are listed in order of increasing strength to get the job done.  They are all available over the counter, and you may need to use some, POSSIBLY even all of these options:    Drink plenty of fluids (prune juice may be helpful) and high fiber foods Colace 100 mg by mouth twice a day  Senokot for  constipation as directed and as needed Dulcolax (bisacodyl), take with full glass of water  Miralax (polyethylene glycol) once or twice a day as needed.  If you have tried all these things and are unable to have a  bowel movement in the first 3-4 days after surgery call either your surgeon or your primary doctor.    If you experience loose stools or diarrhea, hold the medications until you stool forms back up.  If your symptoms do not get better within 1 week or if they get worse, check with your doctor.  If you experience "the worst abdominal pain ever" or develop nausea or vomiting, please contact the office immediately for further recommendations for treatment.   ITCHING:  If you experience itching with your medications, try taking only a single pain pill, or even half a pain pill at a time.  You can also use Benadryl over the counter for itching or also to help with sleep.   TED HOSE STOCKINGS:  Use stockings on both legs until for at least 2 weeks or as directed by physician office. They may be removed at night for sleeping.  MEDICATIONS:  See your medication summary on the "After Visit Summary" that nursing will review with you.  You may have some home medications which will be placed on hold until you complete the course of blood thinner medication.  It is important for you to complete the blood thinner medication as prescribed.  PRECAUTIONS:  If you experience chest pain or shortness of breath - call 911 immediately for transfer to the hospital emergency department.   If you develop a fever greater that 101 F, purulent drainage from wound, increased redness or drainage from wound, foul odor from the wound/dressing, or calf pain - CONTACT YOUR SURGEON.                                                   FOLLOW-UP APPOINTMENTS:  If you do not already have a post-op appointment, please call the office for an appointment to be seen by your surgeon.  Guidelines for how soon to be seen are listed in your "After Visit Summary", but are typically between 1-4 weeks after surgery.  OTHER INSTRUCTIONS:   Knee Replacement:  Do not place pillow under knee, focus on keeping the knee straight while resting. CPM  instructions: 0-90 degrees, 2 hours in the morning, 2 hours in the afternoon, and 2 hours in the evening. Place foam block, curve side up under heel at all times except when in CPM or when walking.  DO NOT modify, tear, cut, or change the foam block in any way.  MAKE SURE YOU:  Understand these instructions.  Get help right away if you are not doing well or get worse.    Thank you for letting us be a part of your medical care team.  It is a privilege we respect greatly.  We hope these instructions will help you stay on track for a fast and full recovery!   Increase activity slowly as tolerated   Complete by: As directed       DISCHARGE MEDICATIONS:   Allergies as of 01/14/2019   No Known Allergies     Medication List    TAKE these medications   aspirin 325 MG EC tablet Take 1 tablet (325 mg total) by mouth  2 (two) times daily. What changed:   medication strength  how much to take  when to take this   carvedilol 12.5 MG tablet Commonly known as: COREG Take 12.5 mg by mouth 2 (two) times daily with a meal.   furosemide 40 MG tablet Commonly known as: LASIX Take 1 tablet (40 mg total) by mouth daily.   lisinopril-hydrochlorothiazide 20-25 MG tablet Commonly known as: ZESTORETIC Take 1 tablet by mouth daily.   methocarbamol 500 MG tablet Commonly known as: ROBAXIN Take 1-2 tablets (500-1,000 mg total) by mouth every 6 (six) hours as needed for muscle spasms.   multivitamin-lutein Caps capsule Take 1 capsule by mouth daily.   oxyCODONE 5 MG immediate release tablet Commonly known as: Oxy IR/ROXICODONE Take 1-2 tablets (5-10 mg total) by mouth every 6 (six) hours as needed for moderate pain (pain score 4-6).   tamsulosin 0.4 MG Caps capsule Commonly known as: FLOMAX Take 0.4 mg by mouth at bedtime.   URINOZINC PLUS PO Take 2 tablets by mouth daily.       FOLLOW UP VISIT:   Follow-up Information    [facility] Follow up.   Specialty: [facility] Contact information: [place] [address] [phone]           DISPOSITION: HOME VS. SNF  CONDITION:  Good   Donia Ast 01/19/2019, 1:20 PM

## 2019-01-20 DIAGNOSIS — M25462 Effusion, left knee: Secondary | ICD-10-CM | POA: Diagnosis not present

## 2019-01-20 DIAGNOSIS — M25562 Pain in left knee: Secondary | ICD-10-CM | POA: Diagnosis not present

## 2019-01-20 DIAGNOSIS — M62552 Muscle wasting and atrophy, not elsewhere classified, left thigh: Secondary | ICD-10-CM | POA: Diagnosis not present

## 2019-01-20 DIAGNOSIS — R2689 Other abnormalities of gait and mobility: Secondary | ICD-10-CM | POA: Diagnosis not present

## 2019-01-20 DIAGNOSIS — Z96652 Presence of left artificial knee joint: Secondary | ICD-10-CM | POA: Diagnosis not present

## 2019-01-23 DIAGNOSIS — R2689 Other abnormalities of gait and mobility: Secondary | ICD-10-CM | POA: Diagnosis not present

## 2019-01-23 DIAGNOSIS — M25462 Effusion, left knee: Secondary | ICD-10-CM | POA: Diagnosis not present

## 2019-01-23 DIAGNOSIS — M25562 Pain in left knee: Secondary | ICD-10-CM | POA: Diagnosis not present

## 2019-01-23 DIAGNOSIS — M62552 Muscle wasting and atrophy, not elsewhere classified, left thigh: Secondary | ICD-10-CM | POA: Diagnosis not present

## 2019-01-23 DIAGNOSIS — Z96652 Presence of left artificial knee joint: Secondary | ICD-10-CM | POA: Diagnosis not present

## 2019-01-27 DIAGNOSIS — M25462 Effusion, left knee: Secondary | ICD-10-CM | POA: Diagnosis not present

## 2019-01-27 DIAGNOSIS — M25562 Pain in left knee: Secondary | ICD-10-CM | POA: Diagnosis not present

## 2019-01-27 DIAGNOSIS — R2689 Other abnormalities of gait and mobility: Secondary | ICD-10-CM | POA: Diagnosis not present

## 2019-01-27 DIAGNOSIS — M62552 Muscle wasting and atrophy, not elsewhere classified, left thigh: Secondary | ICD-10-CM | POA: Diagnosis not present

## 2019-01-27 DIAGNOSIS — Z96652 Presence of left artificial knee joint: Secondary | ICD-10-CM | POA: Diagnosis not present

## 2019-01-29 DIAGNOSIS — Z96652 Presence of left artificial knee joint: Secondary | ICD-10-CM | POA: Diagnosis not present

## 2019-01-29 DIAGNOSIS — M62552 Muscle wasting and atrophy, not elsewhere classified, left thigh: Secondary | ICD-10-CM | POA: Diagnosis not present

## 2019-01-29 DIAGNOSIS — M25562 Pain in left knee: Secondary | ICD-10-CM | POA: Diagnosis not present

## 2019-01-29 DIAGNOSIS — M25462 Effusion, left knee: Secondary | ICD-10-CM | POA: Diagnosis not present

## 2019-01-29 DIAGNOSIS — R2689 Other abnormalities of gait and mobility: Secondary | ICD-10-CM | POA: Diagnosis not present

## 2019-01-31 DIAGNOSIS — M62552 Muscle wasting and atrophy, not elsewhere classified, left thigh: Secondary | ICD-10-CM | POA: Diagnosis not present

## 2019-01-31 DIAGNOSIS — M25462 Effusion, left knee: Secondary | ICD-10-CM | POA: Diagnosis not present

## 2019-01-31 DIAGNOSIS — M25562 Pain in left knee: Secondary | ICD-10-CM | POA: Diagnosis not present

## 2019-01-31 DIAGNOSIS — Z96652 Presence of left artificial knee joint: Secondary | ICD-10-CM | POA: Diagnosis not present

## 2019-01-31 DIAGNOSIS — R2689 Other abnormalities of gait and mobility: Secondary | ICD-10-CM | POA: Diagnosis not present

## 2019-02-04 DIAGNOSIS — M25562 Pain in left knee: Secondary | ICD-10-CM | POA: Diagnosis not present

## 2019-02-04 DIAGNOSIS — M25462 Effusion, left knee: Secondary | ICD-10-CM | POA: Diagnosis not present

## 2019-02-04 DIAGNOSIS — Z96652 Presence of left artificial knee joint: Secondary | ICD-10-CM | POA: Diagnosis not present

## 2019-02-04 DIAGNOSIS — R2689 Other abnormalities of gait and mobility: Secondary | ICD-10-CM | POA: Diagnosis not present

## 2019-02-04 DIAGNOSIS — M62552 Muscle wasting and atrophy, not elsewhere classified, left thigh: Secondary | ICD-10-CM | POA: Diagnosis not present

## 2019-02-06 DIAGNOSIS — R609 Edema, unspecified: Secondary | ICD-10-CM | POA: Diagnosis not present

## 2019-02-06 DIAGNOSIS — I10 Essential (primary) hypertension: Secondary | ICD-10-CM | POA: Diagnosis not present

## 2019-02-06 DIAGNOSIS — Z23 Encounter for immunization: Secondary | ICD-10-CM | POA: Diagnosis not present

## 2019-02-06 DIAGNOSIS — E785 Hyperlipidemia, unspecified: Secondary | ICD-10-CM | POA: Diagnosis not present

## 2019-02-06 DIAGNOSIS — K219 Gastro-esophageal reflux disease without esophagitis: Secondary | ICD-10-CM | POA: Diagnosis not present

## 2019-02-07 DIAGNOSIS — M62552 Muscle wasting and atrophy, not elsewhere classified, left thigh: Secondary | ICD-10-CM | POA: Diagnosis not present

## 2019-02-07 DIAGNOSIS — M25562 Pain in left knee: Secondary | ICD-10-CM | POA: Diagnosis not present

## 2019-02-07 DIAGNOSIS — Z96652 Presence of left artificial knee joint: Secondary | ICD-10-CM | POA: Diagnosis not present

## 2019-02-07 DIAGNOSIS — M25462 Effusion, left knee: Secondary | ICD-10-CM | POA: Diagnosis not present

## 2019-02-07 DIAGNOSIS — R2689 Other abnormalities of gait and mobility: Secondary | ICD-10-CM | POA: Diagnosis not present

## 2019-02-11 DIAGNOSIS — Z96652 Presence of left artificial knee joint: Secondary | ICD-10-CM | POA: Diagnosis not present

## 2019-02-11 DIAGNOSIS — M25562 Pain in left knee: Secondary | ICD-10-CM | POA: Diagnosis not present

## 2019-02-11 DIAGNOSIS — M25462 Effusion, left knee: Secondary | ICD-10-CM | POA: Diagnosis not present

## 2019-02-11 DIAGNOSIS — M62552 Muscle wasting and atrophy, not elsewhere classified, left thigh: Secondary | ICD-10-CM | POA: Diagnosis not present

## 2019-02-11 DIAGNOSIS — R2689 Other abnormalities of gait and mobility: Secondary | ICD-10-CM | POA: Diagnosis not present

## 2019-02-13 DIAGNOSIS — M25462 Effusion, left knee: Secondary | ICD-10-CM | POA: Diagnosis not present

## 2019-02-13 DIAGNOSIS — M62552 Muscle wasting and atrophy, not elsewhere classified, left thigh: Secondary | ICD-10-CM | POA: Diagnosis not present

## 2019-02-13 DIAGNOSIS — R2689 Other abnormalities of gait and mobility: Secondary | ICD-10-CM | POA: Diagnosis not present

## 2019-02-13 DIAGNOSIS — M25562 Pain in left knee: Secondary | ICD-10-CM | POA: Diagnosis not present

## 2019-02-13 DIAGNOSIS — Z96652 Presence of left artificial knee joint: Secondary | ICD-10-CM | POA: Diagnosis not present

## 2019-02-25 DIAGNOSIS — M62552 Muscle wasting and atrophy, not elsewhere classified, left thigh: Secondary | ICD-10-CM | POA: Diagnosis not present

## 2019-02-25 DIAGNOSIS — Z96652 Presence of left artificial knee joint: Secondary | ICD-10-CM | POA: Diagnosis not present

## 2019-02-25 DIAGNOSIS — R2689 Other abnormalities of gait and mobility: Secondary | ICD-10-CM | POA: Diagnosis not present

## 2019-02-25 DIAGNOSIS — M25462 Effusion, left knee: Secondary | ICD-10-CM | POA: Diagnosis not present

## 2019-02-25 DIAGNOSIS — M25562 Pain in left knee: Secondary | ICD-10-CM | POA: Diagnosis not present

## 2019-02-27 DIAGNOSIS — R2689 Other abnormalities of gait and mobility: Secondary | ICD-10-CM | POA: Diagnosis not present

## 2019-02-27 DIAGNOSIS — M25562 Pain in left knee: Secondary | ICD-10-CM | POA: Diagnosis not present

## 2019-02-27 DIAGNOSIS — Z96652 Presence of left artificial knee joint: Secondary | ICD-10-CM | POA: Diagnosis not present

## 2019-02-27 DIAGNOSIS — M62552 Muscle wasting and atrophy, not elsewhere classified, left thigh: Secondary | ICD-10-CM | POA: Diagnosis not present

## 2019-02-27 DIAGNOSIS — M25462 Effusion, left knee: Secondary | ICD-10-CM | POA: Diagnosis not present

## 2019-03-04 DIAGNOSIS — R2689 Other abnormalities of gait and mobility: Secondary | ICD-10-CM | POA: Diagnosis not present

## 2019-03-04 DIAGNOSIS — M25562 Pain in left knee: Secondary | ICD-10-CM | POA: Diagnosis not present

## 2019-03-04 DIAGNOSIS — Z96652 Presence of left artificial knee joint: Secondary | ICD-10-CM | POA: Diagnosis not present

## 2019-03-04 DIAGNOSIS — M25462 Effusion, left knee: Secondary | ICD-10-CM | POA: Diagnosis not present

## 2019-03-04 DIAGNOSIS — M62552 Muscle wasting and atrophy, not elsewhere classified, left thigh: Secondary | ICD-10-CM | POA: Diagnosis not present

## 2019-03-07 DIAGNOSIS — M25462 Effusion, left knee: Secondary | ICD-10-CM | POA: Diagnosis not present

## 2019-03-07 DIAGNOSIS — R2689 Other abnormalities of gait and mobility: Secondary | ICD-10-CM | POA: Diagnosis not present

## 2019-03-07 DIAGNOSIS — M25562 Pain in left knee: Secondary | ICD-10-CM | POA: Diagnosis not present

## 2019-03-07 DIAGNOSIS — M62552 Muscle wasting and atrophy, not elsewhere classified, left thigh: Secondary | ICD-10-CM | POA: Diagnosis not present

## 2019-03-07 DIAGNOSIS — Z96652 Presence of left artificial knee joint: Secondary | ICD-10-CM | POA: Diagnosis not present

## 2019-05-14 DIAGNOSIS — H43393 Other vitreous opacities, bilateral: Secondary | ICD-10-CM | POA: Diagnosis not present

## 2019-05-14 DIAGNOSIS — H04123 Dry eye syndrome of bilateral lacrimal glands: Secondary | ICD-10-CM | POA: Diagnosis not present

## 2019-05-14 DIAGNOSIS — H5203 Hypermetropia, bilateral: Secondary | ICD-10-CM | POA: Diagnosis not present

## 2019-05-27 ENCOUNTER — Other Ambulatory Visit: Payer: Self-pay | Admitting: Cardiology

## 2019-05-27 DIAGNOSIS — I10 Essential (primary) hypertension: Secondary | ICD-10-CM

## 2019-05-27 DIAGNOSIS — R0602 Shortness of breath: Secondary | ICD-10-CM

## 2019-05-27 NOTE — Telephone Encounter (Signed)
Rx refill sent to pharmacy. 

## 2019-06-16 DIAGNOSIS — Z125 Encounter for screening for malignant neoplasm of prostate: Secondary | ICD-10-CM | POA: Diagnosis not present

## 2019-06-16 DIAGNOSIS — E785 Hyperlipidemia, unspecified: Secondary | ICD-10-CM | POA: Diagnosis not present

## 2019-06-16 DIAGNOSIS — Z79899 Other long term (current) drug therapy: Secondary | ICD-10-CM | POA: Diagnosis not present

## 2019-06-16 DIAGNOSIS — I10 Essential (primary) hypertension: Secondary | ICD-10-CM | POA: Diagnosis not present

## 2019-06-16 DIAGNOSIS — R609 Edema, unspecified: Secondary | ICD-10-CM | POA: Diagnosis not present

## 2019-06-16 DIAGNOSIS — K219 Gastro-esophageal reflux disease without esophagitis: Secondary | ICD-10-CM | POA: Diagnosis not present

## 2019-08-20 DIAGNOSIS — Z79899 Other long term (current) drug therapy: Secondary | ICD-10-CM | POA: Diagnosis not present

## 2019-08-20 DIAGNOSIS — N401 Enlarged prostate with lower urinary tract symptoms: Secondary | ICD-10-CM | POA: Diagnosis not present

## 2019-08-20 DIAGNOSIS — C61 Malignant neoplasm of prostate: Secondary | ICD-10-CM | POA: Diagnosis not present

## 2019-08-20 DIAGNOSIS — R351 Nocturia: Secondary | ICD-10-CM | POA: Diagnosis not present

## 2019-10-01 ENCOUNTER — Ambulatory Visit (INDEPENDENT_AMBULATORY_CARE_PROVIDER_SITE_OTHER): Payer: Medicare Other | Admitting: Cardiology

## 2019-10-01 ENCOUNTER — Encounter: Payer: Self-pay | Admitting: Cardiology

## 2019-10-01 ENCOUNTER — Other Ambulatory Visit: Payer: Self-pay

## 2019-10-01 VITALS — BP 128/80 | HR 75 | Ht 66.0 in | Wt 265.0 lb

## 2019-10-01 DIAGNOSIS — I10 Essential (primary) hypertension: Secondary | ICD-10-CM | POA: Diagnosis not present

## 2019-10-01 DIAGNOSIS — I7789 Other specified disorders of arteries and arterioles: Secondary | ICD-10-CM | POA: Diagnosis not present

## 2019-10-01 MED ORDER — NEBIVOLOL HCL 5 MG PO TABS: 5.0000 mg | ORAL_TABLET | Freq: Every day | ORAL | 3 refills | Status: DC

## 2019-10-01 NOTE — Patient Instructions (Signed)
Medication Instructions:  Your physician has recommended you make the following change in your medication:  STOP: Carvedilol START: Byastolic 5 mg take one tablet by mouth daily.  *If you need a refill on your cardiac medications before your next appointment, please call your pharmacy*   Lab Work: None If you have labs (blood work) drawn today and your tests are completely normal, you will receive your results only by: Marland Kitchen MyChart Message (if you have MyChart) OR . A paper copy in the mail If you have any lab test that is abnormal or we need to change your treatment, we will call you to review the results.   Testing/Procedures: None   Follow-Up: At [facility], you and your health needs are our priority.  As part of our continuing mission to provide you with exceptional heart care, we have created designated Provider Care Teams.  These Care Teams include your primary Cardiologist (physician) and Advanced Practice Providers (APPs -  Physician Assistants and Nurse Practitioners) who all work together to provide you with the care you need, when you need it.  We recommend signing up for the patient portal called "MyChart".  Sign up information is provided on this After Visit Summary.  MyChart is used to connect with patients for Virtual Visits (Telemedicine).  Patients are able to view lab/test results, encounter notes, upcoming appointments, etc.  Non-urgent messages can be sent to your provider as well.   To learn more about what you can do with MyChart, go to [email or web address].    Your next appointment:   6 month(s)  The format for your next appointment:   In Person  Provider:   Shirlee More, MD   Other Instructions

## 2019-10-01 NOTE — Progress Notes (Signed)
Cardiology Office Note:    Date:  10/01/2019   ID:  Frederick Blew., DOB May 08, 1950, MRN [id number]  PCP:  Helen Hashimoto., MD  Cardiologist:  Shirlee More, MD    Referring MD: Helen Hashimoto., MD    ASSESSMENT:    1. Essential hypertension   2. Enlarged thoracic aorta (HCC)    PLAN:    In order of problems listed above:  1. He will continue ACE inhibitor thiazide loop diuretic and substitute Bystolic for carvedilol with concerns of edema 2. Stable on serial echocardiogram I do not think he requires repeat imaging in the near future 3. Hyperlipidemia he declines treatment if he were to accept lipid-lowering therapy.cardiac calcium score would help to judge risk if his score was 0 he had no benefit from statin up in the next 5 to 7 years.   Next appointment: 6 months   Medication Adjustments/Labs and Tests Ordered: Current medicines are reviewed at length with the patient today.  Concerns regarding medicines are outlined above.  No orders of the defined types were placed in this encounter.  Meds ordered this encounter  Medications  . nebivolol (BYSTOLIC) 5 MG tablet    Sig: Take 1 tablet (5 mg total) by mouth daily.    Dispense:  90 tablet    Refill:  3    No chief complaint on file.   History of Present Illness:    Frederick Silber. is a 69 y.o. male with a hx of hypertension and prostate cancer with active surveillance BMI>40  SOB and edema seen 04/08/18.   His D Dimer was low risk at  0.38 and echo did not show findings of systolic or diastolic heart failure and a follow-up echocardiogram performed August 2020 showed stable ascending aorta 39 mm. He was last seen 05/06/2018.  Compliance with diet, lifestyle and medications: Yes  He still has edema is diminished he thinks carvedilol plays a role in over and does withdraw and substitute Bystolic with his vasodilator effect.  No angina dyspnea orthopnea palpitation or syncope.  I reviewed his echocardiogram  findings with him.  Recent labs show elevated lipids LDL 138 cholesterol 217 HDL 44 and he adamantly will not accept lipid-lowering therapy. Past Medical History:  Diagnosis Date  . Arthritis   . Cataract   . Dyspnea 04/2018  . Hypercholesteremia   . Hypertension   . Lower extremity edema   . Morbid obesity ([place])   . Prostate cancer [facility])     Past Surgical History:  Procedure Laterality Date  . CATARACT EXTRACTION, BILATERAL  2015  . KNEE ARTHROSCOPY Left 2017  . PROSTATE BIOPSY    . SHOULDER SURGERY Left   . TOTAL KNEE ARTHROPLASTY Left 01/13/2019   Procedure: TOTAL KNEE ARTHROPLASTY;  Surgeon: Vickey Huger, MD;  Location: [facility];  Service: Orthopedics;  Laterality: Left;  . TRANSURETHRAL RESECTION OF PROSTATE  2014    Current Medications: Current Meds  Medication Sig  . ASPIRIN 81 PO Take 81 mg by mouth daily.  . furosemide (LASIX) 40 MG tablet Take 1 tablet by mouth once daily  . lisinopril-hydrochlorothiazide (PRINZIDE,ZESTORETIC) 20-25 MG tablet Take 1 tablet by mouth daily.  . Misc Natural Products (URINOZINC PLUS PO) Take 2 tablets by mouth daily.   . multivitamin-lutein (OCUVITE-LUTEIN) CAPS capsule Take 1 capsule by mouth daily.  . tamsulosin (FLOMAX) 0.4 MG CAPS capsule Take 0.4 mg by mouth at bedtime.   . [DISCONTINUED] carvedilol (COREG) 12.5 MG tablet Take 12.5 mg by  mouth daily.      Allergies:   Patient has no known allergies.   Social History   Socioeconomic History  . Marital status: Single    Spouse name: Not on file  . Number of children: Not on file  . Years of education: Not on file  . Highest education level: Not on file  Occupational History  . Not on file  Tobacco Use  . Smoking status: Never Smoker  . Smokeless tobacco: Never Used  Substance and Sexual Activity  . Alcohol use: Yes    Comment: occasional  . Drug use: No  . Sexual activity: Not on file  Other Topics Concern  . Not on file  Social History Narrative  . Not on file    Social Determinants of Health   Financial Resource Strain:   . Difficulty of Paying Living Expenses:   Food Insecurity:   . Worried About Charity fundraiser in the Last Year:   . Arboriculturist in the Last Year:   Transportation Needs:   . Film/video editor (Medical):   Marland Kitchen Lack of Transportation (Non-Medical):   Physical Activity:   . Days of Exercise per Week:   . Minutes of Exercise per Session:   Stress:   . Feeling of Stress :   Social Connections:   . Frequency of Communication with Friends and Family:   . Frequency of Social Gatherings with Friends and Family:   . Attends Religious Services:   . Active Member of Clubs or Organizations:   . Attends Archivist Meetings:   Marland Kitchen Marital Status:      Family History: The patient's family history includes Arthritis in his mother; Cancer in his brother, brother, and sister; Cancer (age of onset: 26) in his paternal uncle. ROS:   Please see the history of present illness.    All other systems reviewed and are negative.  EKGs/Labs/Other Studies Reviewed:    The following studies were reviewed today:   Recent Labs: 01/13/2019: ALT 25 01/14/2019: BUN 16; Creatinine, Ser 1.06; Hemoglobin 12.7; Platelets 260; Potassium 4.2; Sodium 141    Physical Exam:    VS:  BP 128/80 (BP Location: Right Arm, Patient Position: Sitting, Cuff Size: Normal)   Pulse 75   Ht 5\' 6"  (1.676 m)   Wt 265 lb (120.2 kg)   SpO2 95%   BMI 42.77 kg/m     Wt Readings from Last 3 Encounters:  10/01/19 265 lb (120.2 kg)  01/13/19 256 lb 1 oz (116.1 kg)  01/07/19 256 lb 1 oz (116.1 kg)     GEN:  Well nourished, well developed in no acute distress HEENT: Normal NECK: No JVD; No carotid bruits LYMPHATICS: No lymphadenopathy CARDIAC: RRR, no murmurs, rubs, gallops RESPIRATORY:  Clear to auscultation without rales, wheezing or rhonchi  ABDOMEN: Soft, non-tender, non-distended MUSCULOSKELETAL:  No edema; No deformity  SKIN: Warm and  dry NEUROLOGIC:  Alert and oriented x 3 PSYCHIATRIC:  Normal affect    Signed, Shirlee More, MD  10/01/2019 1:54 PM    [facility]

## 2019-10-07 ENCOUNTER — Telehealth: Payer: Self-pay | Admitting: Cardiology

## 2019-10-07 ENCOUNTER — Other Ambulatory Visit: Payer: Self-pay | Admitting: Cardiology

## 2019-10-07 DIAGNOSIS — I10 Essential (primary) hypertension: Secondary | ICD-10-CM

## 2019-10-07 DIAGNOSIS — R0602 Shortness of breath: Secondary | ICD-10-CM

## 2019-10-07 MED ORDER — METOPROLOL SUCCINATE ER 50 MG PO TB24: 50.0000 mg | ORAL_TABLET | Freq: Every day | ORAL | 3 refills | Status: DC

## 2019-10-07 MED ORDER — FUROSEMIDE 40 MG PO TABS: 40.0000 mg | ORAL_TABLET | Freq: Every day | ORAL | 3 refills | Status: DC

## 2019-10-07 NOTE — Telephone Encounter (Signed)
Pt c/o medication issue:  1. Name of Medication: nebivolol (BYSTOLIC) 5 MG tablet  2. How are you currently taking this medication (dosage and times per day)? Not currently taking medication   3. Are you having a reaction (difficulty breathing--STAT)? No   4. What is your medication issue? Frederick Carter is calling stating he cannot afford this medication. He states it was over $600 when he went to go pick it up at the pharmacy. Please advise.

## 2019-10-07 NOTE — Telephone Encounter (Signed)
*  STAT* If patient is at the pharmacy, call can be transferred to refill team.   1. Which medications need to be refilled? (please list name of each medication and dose if known)  furosemide (LASIX) 40 MG tablet  2. Which pharmacy/location (including street and city if local pharmacy) is medication to be sent to? [facility], [place] - [address]  3. Do they need a 30 day or 90 day supply? 90 day supply

## 2019-10-07 NOTE — Telephone Encounter (Signed)
Toprol XL 50 mg day

## 2019-10-07 NOTE — Telephone Encounter (Signed)
Refill sent in per request.  

## 2019-10-07 NOTE — Telephone Encounter (Signed)
Spoke to the patient just now and let him know that Dr. Bettina Gavia was recommending that he can take the TOPROL XL 50 mg daily in place of the bystolic. He verbalizes understanding and states that he will see how much the TOPROL XL will be from the pharmacy.   Encouraged patient to call back with any questions or concerns.

## 2020-01-01 DIAGNOSIS — M7071 Other bursitis of hip, right hip: Secondary | ICD-10-CM | POA: Diagnosis not present

## 2020-01-15 DIAGNOSIS — M7061 Trochanteric bursitis, right hip: Secondary | ICD-10-CM

## 2020-01-15 HISTORY — DX: Trochanteric bursitis, right hip: M70.61

## 2020-02-12 DIAGNOSIS — Z23 Encounter for immunization: Secondary | ICD-10-CM | POA: Diagnosis not present

## 2020-02-17 DIAGNOSIS — Z8546 Personal history of malignant neoplasm of prostate: Secondary | ICD-10-CM | POA: Diagnosis not present

## 2020-02-17 DIAGNOSIS — Z6841 Body Mass Index (BMI) 40.0 and over, adult: Secondary | ICD-10-CM | POA: Diagnosis not present

## 2020-02-17 DIAGNOSIS — I10 Essential (primary) hypertension: Secondary | ICD-10-CM | POA: Diagnosis not present

## 2020-02-17 DIAGNOSIS — Z23 Encounter for immunization: Secondary | ICD-10-CM | POA: Diagnosis not present

## 2020-02-17 DIAGNOSIS — E782 Mixed hyperlipidemia: Secondary | ICD-10-CM | POA: Diagnosis not present

## 2020-02-17 DIAGNOSIS — K068 Other specified disorders of gingiva and edentulous alveolar ridge: Secondary | ICD-10-CM | POA: Diagnosis not present

## 2020-02-19 DIAGNOSIS — K134 Granuloma and granuloma-like lesions of oral mucosa: Secondary | ICD-10-CM | POA: Diagnosis not present

## 2020-02-19 DIAGNOSIS — N401 Enlarged prostate with lower urinary tract symptoms: Secondary | ICD-10-CM | POA: Diagnosis not present

## 2020-02-19 DIAGNOSIS — Z79899 Other long term (current) drug therapy: Secondary | ICD-10-CM | POA: Diagnosis not present

## 2020-02-19 DIAGNOSIS — C61 Malignant neoplasm of prostate: Secondary | ICD-10-CM | POA: Diagnosis not present

## 2020-03-02 DIAGNOSIS — K134 Granuloma and granuloma-like lesions of oral mucosa: Secondary | ICD-10-CM | POA: Diagnosis not present

## 2020-03-25 DIAGNOSIS — H269 Unspecified cataract: Secondary | ICD-10-CM | POA: Insufficient documentation

## 2020-03-25 DIAGNOSIS — R600 Localized edema: Secondary | ICD-10-CM | POA: Insufficient documentation

## 2020-03-25 DIAGNOSIS — M1990 Unspecified osteoarthritis, unspecified site: Secondary | ICD-10-CM | POA: Insufficient documentation

## 2020-03-25 DIAGNOSIS — C61 Malignant neoplasm of prostate: Secondary | ICD-10-CM | POA: Insufficient documentation

## 2020-03-25 DIAGNOSIS — I10 Essential (primary) hypertension: Secondary | ICD-10-CM | POA: Insufficient documentation

## 2020-03-25 DIAGNOSIS — E7800 Pure hypercholesterolemia, unspecified: Secondary | ICD-10-CM | POA: Insufficient documentation

## 2020-04-04 NOTE — Progress Notes (Signed)
Cardiology Office Note:    Date:  04/05/2020   ID:  Frederick Blew., DOB 10-06-1950, MRN [id number]  PCP:  Helen Hashimoto., MD  Cardiologist:  Shirlee More, MD    Referring MD: Helen Hashimoto., MD    ASSESSMENT:    1. Essential hypertension   2. Enlarged thoracic aorta ([place])   3. SOB (shortness of breath)   4. Lower extremity edema   5. Thoracic aortic aneurysm without rupture ([place])   6. Hypercholesteremia    PLAN:    In order of problems listed above:  1. Blood pressure at goal and acceptable less than 130/80.  Continue current medication therapy. 2. Recommend considering CT for most accurate measurement if patient interested in the future.  Aortic size has been stable on serial echocardiogram in the past. 3. Continue loop diuretic and ACE inhibitor for lower extremity edema. 4. Patient declined treatment for hyperlipidemia at this time despite counseling.  He also prefers not to have a cardiac calcium score at this time.  Next appointment: 1 year   Medication Adjustments/Labs and Tests Ordered: Current medicines are reviewed at length with the patient today.  Concerns regarding medicines are outlined above.  Orders Placed This Encounter  Procedures  . CT Chest Wo Contrast  . EKG 12-Lead   Meds ordered this encounter  Medications  . lisinopril-hydrochlorothiazide (ZESTORETIC) 20-25 MG tablet    Sig: Take 1 tablet by mouth daily.    Dispense:  90 tablet    Refill:  3  . furosemide (LASIX) 40 MG tablet    Sig: Take 1 tablet (40 mg total) by mouth daily.    Dispense:  90 tablet    Refill:  3    Pt needs office visit before further refills please  . metoprolol succinate (TOPROL XL) 50 MG 24 hr tablet    Sig: Take 1 tablet (50 mg total) by mouth daily. Take with or immediately following a meal.    Dispense:  90 tablet    Refill:  3    Chief Complaint  Patient presents with  . Follow-up  . Hypertension  . Leg Swelling    History of Present  Illness:    Frederick Carter. is a 69 y.o. male with a hx of hypertension mild enlargement ascending aorta 39 mm and prostate cancer with active surveillance obesity BMI greater than 50 last seen 10/01/2019.  With shortness of breath he underwent echocardiogram 12/17/2018 showing EF 60 to 65% normal left and right ventricular function normal left ventricular filling pressure and no evidence of pulmonary artery hypertension.  Patient reports average blood pressure at home is 125/80.  He has had some shortness of breath, comes and goes, he is not noticing improvement or association with his weight changing or with pedal edema.  We discussed option of a statin as his LDL is elevated, patient is not interested in trying a statin at this time due to muscle spasms.  We also discussed possibility of obtaining CT chest to measure aorta, patient is not interested at this time.  Compliance with diet, lifestyle and medications: Yes Past Medical History:  Diagnosis Date  . Arthritis   . Cataract   . Dyspnea 04/2018  . Enlarged thoracic aorta ([place]) 05/06/2018  . Essential hypertension 04/08/2018  . Genetic testing 01/11/2017   Cancelled appointment and declined r/s.  Marland Kitchen Hypercholesteremia   . Hypertension   . Lower extremity edema   . Malignant neoplasm of prostate ([place]) 01/01/2017  . Morbid  obesity ([place])   . Prostate cancer ([place])   . 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 SOB (shortness of breath) 04/07/2018    Past Surgical History:  Procedure Laterality Date  . CATARACT EXTRACTION, BILATERAL  2015  . KNEE ARTHROSCOPY Left 2017  . PROSTATE BIOPSY    . SHOULDER SURGERY Left   . TOTAL KNEE ARTHROPLASTY Left 01/13/2019   Procedure: TOTAL KNEE ARTHROPLASTY;  Surgeon: Vickey Huger, MD;  Location: [facility];  Service: Orthopedics;  Laterality: Left;  . TRANSURETHRAL RESECTION OF PROSTATE  2014    Current Medications: Current Meds  Medication Sig  . ASPIRIN 81 PO Take 81 mg by mouth daily.  . Misc  Natural Products (URINOZINC PLUS PO) Take 2 tablets by mouth daily.   . multivitamin-lutein (OCUVITE-LUTEIN) CAPS capsule Take 1 capsule by mouth daily.  . tamsulosin (FLOMAX) 0.4 MG CAPS capsule Take 0.4 mg by mouth at bedtime.   . [DISCONTINUED] furosemide (LASIX) 40 MG tablet Take 1 tablet (40 mg total) by mouth daily.  . [DISCONTINUED] lisinopril-hydrochlorothiazide (PRINZIDE,ZESTORETIC) 20-25 MG tablet Take 1 tablet by mouth daily.  . [DISCONTINUED] metoprolol succinate (TOPROL XL) 50 MG 24 hr tablet Take 1 tablet (50 mg total) by mouth daily. Take with or immediately following a meal.     Allergies:   Patient has no known allergies.   Social History   Socioeconomic History  . Marital status: Single    Spouse name: Not on file  . Number of children: Not on file  . Years of education: Not on file  . Highest education level: Not on file  Occupational History  . Not on file  Tobacco Use  . Smoking status: Never Smoker  . Smokeless tobacco: Never Used  Vaping Use  . Vaping Use: Never used  Substance and Sexual Activity  . Alcohol use: Yes    Comment: occasional  . Drug use: No  . Sexual activity: Not on file  Other Topics Concern  . Not on file  Social History Narrative  . Not on file   Social Determinants of Health   Financial Resource Strain: Not on file  Food Insecurity: Not on file  Transportation Needs: Not on file  Physical Activity: Not on file  Stress: Not on file  Social Connections: Not on file     Family History: The patient's family history includes Arthritis in his mother; Cancer in his brother, brother, and sister; Cancer (age of onset: 95) in his paternal uncle. ROS:   Please see the history of present illness.    All other systems reviewed and are negative.  EKGs/Labs/Other Studies Reviewed:    The following studies were reviewed today:  EKG:  EKG ordered today and personally reviewed.  The ekg ordered today demonstrates NSR.  Recent Labs: No  results found for requested labs within last 8760 hours.  Recent Lipid Panel No results found for: CHOL, TRIG, HDL, CHOLHDL, VLDL, LDLCALC, LDLDIRECT  Physical Exam:    VS:  BP 135/81   Pulse 67   Ht 5\' 6"  (1.676 m)   Wt 265 lb (120.2 kg)   SpO2 97%   BMI 42.77 kg/m     Wt Readings from Last 3 Encounters:  04/05/20 265 lb (120.2 kg)  10/01/19 265 lb (120.2 kg)  01/13/19 256 lb 1 oz (116.1 kg)     GEN: Age-appropriate WM resting comfortably on chair, well nourished, well developed in no acute distress HEENT: Normal NECK: No JVD; No carotid bruits LYMPHATICS: No  lymphadenopathy CARDIAC: RRR, no murmurs, rubs, gallops RESPIRATORY:  Clear to auscultation without rales, wheezing or rhonchi  ABDOMEN: Soft, non-tender, non-distended MUSCULOSKELETAL:  1+ edema; No deformity  SKIN: Warm and dry NEUROLOGIC:  Alert and oriented x 3 PSYCHIATRIC:  Normal affect    Signed, Shirlee More, MD  04/05/2020 9:45 AM    [place]

## 2020-04-05 ENCOUNTER — Encounter: Payer: Self-pay | Admitting: Cardiology

## 2020-04-05 ENCOUNTER — Other Ambulatory Visit: Payer: Self-pay

## 2020-04-05 ENCOUNTER — Ambulatory Visit (INDEPENDENT_AMBULATORY_CARE_PROVIDER_SITE_OTHER): Payer: Medicare Other | Admitting: Cardiology

## 2020-04-05 VITALS — BP 135/81 | HR 67 | Ht 66.0 in | Wt 265.0 lb

## 2020-04-05 DIAGNOSIS — R0602 Shortness of breath: Secondary | ICD-10-CM | POA: Diagnosis not present

## 2020-04-05 DIAGNOSIS — R600 Localized edema: Secondary | ICD-10-CM

## 2020-04-05 DIAGNOSIS — I7789 Other specified disorders of arteries and arterioles: Secondary | ICD-10-CM | POA: Diagnosis not present

## 2020-04-05 DIAGNOSIS — I712 Thoracic aortic aneurysm, without rupture, unspecified: Secondary | ICD-10-CM

## 2020-04-05 DIAGNOSIS — E7800 Pure hypercholesterolemia, unspecified: Secondary | ICD-10-CM

## 2020-04-05 DIAGNOSIS — I10 Essential (primary) hypertension: Secondary | ICD-10-CM

## 2020-04-05 MED ORDER — FUROSEMIDE 40 MG PO TABS: 40.0000 mg | ORAL_TABLET | Freq: Every day | ORAL | 3 refills | Status: DC

## 2020-04-05 MED ORDER — LISINOPRIL-HYDROCHLOROTHIAZIDE 20-25 MG PO TABS: 1.0000 | ORAL_TABLET | Freq: Every day | ORAL | 3 refills | Status: DC

## 2020-04-05 MED ORDER — METOPROLOL SUCCINATE ER 50 MG PO TB24: 50.0000 mg | ORAL_TABLET | Freq: Every day | ORAL | 3 refills | Status: DC

## 2020-04-05 NOTE — Patient Instructions (Signed)
Medication Instructions:  Your physician recommends that you continue on your current medications as directed. Please refer to the Current Medication list given to you today.  *If you need a refill on your cardiac medications before your next appointment, please call your pharmacy*   Lab Work: None If you have labs (blood work) drawn today and your tests are completely normal, you will receive your results only by: Marland Kitchen MyChart Message (if you have MyChart) OR . A paper copy in the mail If you have any lab test that is abnormal or we need to change your treatment, we will call you to review the results.   Testing/Procedures: We have put in the order for you to have a chest CT done without contrast. They will call you to schedule this appointment.    Follow-Up: At [facility], you and your health needs are our priority.  As part of our continuing mission to provide you with exceptional heart care, we have created designated Provider Care Teams.  These Care Teams include your primary Cardiologist (physician) and Advanced Practice Providers (APPs -  Physician Assistants and Nurse Practitioners) who all work together to provide you with the care you need, when you need it.  We recommend signing up for the patient portal called "MyChart".  Sign up information is provided on this After Visit Summary.  MyChart is used to connect with patients for Virtual Visits (Telemedicine).  Patients are able to view lab/test results, encounter notes, upcoming appointments, etc.  Non-urgent messages can be sent to your provider as well.   To learn more about what you can do with MyChart, go to [email or web address].    Your next appointment:   1 year(s)  The format for your next appointment:   In Person  Provider:   Shirlee More, MD   Other Instructions

## 2020-04-27 ENCOUNTER — Ambulatory Visit (HOSPITAL_BASED_OUTPATIENT_CLINIC_OR_DEPARTMENT_OTHER)
Admission: RE | Admit: 2020-04-27 | Discharge: 2020-04-27 | Disposition: A | Discharge status: Home / Self Care | Payer: Medicare Other | Source: Ambulatory Visit | Attending: Cardiology | Admitting: Cardiology

## 2020-04-27 ENCOUNTER — Telehealth: Payer: Self-pay

## 2020-04-27 ENCOUNTER — Other Ambulatory Visit: Payer: Self-pay

## 2020-04-27 DIAGNOSIS — I2510 Atherosclerotic heart disease of native coronary artery without angina pectoris: Secondary | ICD-10-CM | POA: Diagnosis not present

## 2020-04-27 DIAGNOSIS — K449 Diaphragmatic hernia without obstruction or gangrene: Secondary | ICD-10-CM | POA: Diagnosis not present

## 2020-04-27 DIAGNOSIS — I712 Thoracic aortic aneurysm, without rupture, unspecified: Secondary | ICD-10-CM

## 2020-04-27 DIAGNOSIS — M47814 Spondylosis without myelopathy or radiculopathy, thoracic region: Secondary | ICD-10-CM | POA: Diagnosis not present

## 2020-04-27 NOTE — Telephone Encounter (Signed)
-----   Message from Baldo Daub, MD sent at 04/27/2020  9:15 AM EST ----- CT of chest is stable is very mild enlargement ascending aorta unchanged from previous does not have an aneurysm  Looks like his esophagus is inflamed is he having symptoms of heartburn indigestion reflux.  If yes I would refer him to his primary care physician or GI if he seen by them

## 2020-04-27 NOTE — Telephone Encounter (Signed)
Spoke with patient regarding results and recommendation.  Patient verbalizes understanding and is agreeable to plan of care. Advised patient to call back with any issues or concerns.   Patient states that he does have heartburn at times but it is nothing out of the normal for him. He states that he will discuss this with his PCP.

## 2020-05-04 ENCOUNTER — Telehealth: Payer: Self-pay | Admitting: *Deleted

## 2020-05-04 NOTE — Telephone Encounter (Signed)
This RN received call from patient - upon answering pt went into a conversation of having a procedure under Dr Alyson Ingles with radioactive seed implanted several months ago for his prostate cancer. He states his insurance is denying coverage due " they said it was experimental ".  Per conversation he states frustration over not being able to speak to someone directly about the charges to " get this fixed "  He states he is willing to pay his portion but that charges need to be corrected to show " not experimental "  This RN validated his concerns per charges and that he feels he cannot get an appropriate answer, as well as apologized to him- because he is speaking to a nurse that is not associated with his care and is unfamiliar with him.  This RN did give him the number for [facility] - for possible benefit in his concern for best reconciliation.  Frederick Carter did state that his PSA numbers have improved since procedure and he is grateful for that.  Of note - pt has not been seen in this office since 2018.

## 2020-05-12 DIAGNOSIS — Z23 Encounter for immunization: Secondary | ICD-10-CM | POA: Diagnosis not present

## 2020-05-17 DIAGNOSIS — H43393 Other vitreous opacities, bilateral: Secondary | ICD-10-CM | POA: Diagnosis not present

## 2020-05-17 DIAGNOSIS — H11423 Conjunctival edema, bilateral: Secondary | ICD-10-CM | POA: Diagnosis not present

## 2020-05-17 DIAGNOSIS — H16223 Keratoconjunctivitis sicca, not specified as Sjogren's, bilateral: Secondary | ICD-10-CM | POA: Diagnosis not present

## 2020-05-17 DIAGNOSIS — H43812 Vitreous degeneration, left eye: Secondary | ICD-10-CM | POA: Diagnosis not present

## 2020-05-17 DIAGNOSIS — Z961 Presence of intraocular lens: Secondary | ICD-10-CM | POA: Diagnosis not present

## 2020-05-17 DIAGNOSIS — H1013 Acute atopic conjunctivitis, bilateral: Secondary | ICD-10-CM | POA: Diagnosis not present

## 2020-05-17 DIAGNOSIS — H524 Presbyopia: Secondary | ICD-10-CM | POA: Diagnosis not present

## 2020-06-17 DIAGNOSIS — I10 Essential (primary) hypertension: Secondary | ICD-10-CM | POA: Diagnosis not present

## 2020-06-21 DIAGNOSIS — I10 Essential (primary) hypertension: Secondary | ICD-10-CM | POA: Diagnosis not present

## 2020-06-21 DIAGNOSIS — I7789 Other specified disorders of arteries and arterioles: Secondary | ICD-10-CM | POA: Diagnosis not present

## 2020-06-21 DIAGNOSIS — Z7189 Other specified counseling: Secondary | ICD-10-CM | POA: Diagnosis not present

## 2020-06-21 DIAGNOSIS — Z6841 Body Mass Index (BMI) 40.0 and over, adult: Secondary | ICD-10-CM | POA: Diagnosis not present

## 2020-08-19 DIAGNOSIS — Z79899 Other long term (current) drug therapy: Secondary | ICD-10-CM | POA: Diagnosis not present

## 2020-08-19 DIAGNOSIS — C61 Malignant neoplasm of prostate: Secondary | ICD-10-CM | POA: Diagnosis not present

## 2020-08-19 DIAGNOSIS — N401 Enlarged prostate with lower urinary tract symptoms: Secondary | ICD-10-CM | POA: Diagnosis not present

## 2020-12-17 DIAGNOSIS — I10 Essential (primary) hypertension: Secondary | ICD-10-CM | POA: Diagnosis not present

## 2020-12-17 DIAGNOSIS — E782 Mixed hyperlipidemia: Secondary | ICD-10-CM | POA: Diagnosis not present

## 2020-12-20 DIAGNOSIS — E782 Mixed hyperlipidemia: Secondary | ICD-10-CM | POA: Diagnosis not present

## 2020-12-20 DIAGNOSIS — L989 Disorder of the skin and subcutaneous tissue, unspecified: Secondary | ICD-10-CM | POA: Diagnosis not present

## 2020-12-20 DIAGNOSIS — Z0000 Encounter for general adult medical examination without abnormal findings: Secondary | ICD-10-CM | POA: Diagnosis not present

## 2020-12-20 DIAGNOSIS — C61 Malignant neoplasm of prostate: Secondary | ICD-10-CM | POA: Diagnosis not present

## 2020-12-20 DIAGNOSIS — I10 Essential (primary) hypertension: Secondary | ICD-10-CM | POA: Diagnosis not present

## 2021-01-24 DIAGNOSIS — L570 Actinic keratosis: Secondary | ICD-10-CM | POA: Diagnosis not present

## 2021-01-24 DIAGNOSIS — L719 Rosacea, unspecified: Secondary | ICD-10-CM | POA: Diagnosis not present

## 2021-02-13 DIAGNOSIS — Z23 Encounter for immunization: Secondary | ICD-10-CM | POA: Diagnosis not present

## 2021-02-21 DIAGNOSIS — C61 Malignant neoplasm of prostate: Secondary | ICD-10-CM | POA: Diagnosis not present

## 2021-02-21 DIAGNOSIS — N3289 Other specified disorders of bladder: Secondary | ICD-10-CM | POA: Diagnosis not present

## 2021-03-31 ENCOUNTER — Ambulatory Visit (INDEPENDENT_AMBULATORY_CARE_PROVIDER_SITE_OTHER): Payer: Medicare Other | Admitting: Cardiology

## 2021-03-31 ENCOUNTER — Other Ambulatory Visit: Payer: Self-pay

## 2021-03-31 ENCOUNTER — Encounter: Payer: Self-pay | Admitting: Cardiology

## 2021-03-31 VITALS — BP 110/74 | HR 60 | Ht 66.0 in | Wt 229.0 lb

## 2021-03-31 DIAGNOSIS — I4510 Unspecified right bundle-branch block: Secondary | ICD-10-CM

## 2021-03-31 DIAGNOSIS — I7789 Other specified disorders of arteries and arterioles: Secondary | ICD-10-CM | POA: Diagnosis not present

## 2021-03-31 DIAGNOSIS — I10 Essential (primary) hypertension: Secondary | ICD-10-CM | POA: Diagnosis not present

## 2021-03-31 NOTE — Patient Instructions (Signed)

## 2021-03-31 NOTE — Progress Notes (Signed)
Cardiology Office Note:    Date:  03/31/2021   ID:  Frederick Blew., DOB 01-02-1951, MRN [id number]  PCP:  No primary care provider on file.  Cardiologist:  Shirlee More, MD    Referring MD: Helen Hashimoto., MD    ASSESSMENT:    1. Essential hypertension   2. Enlarged thoracic aorta ([place])   3. RBBB    PLAN:    In order of problems listed above:  Stable BP at target continue current treatment I asked him to contact me if he frequently has blood pressures less than 100 he tends to run more in the 1 30-1 20 systolic range and if needed he can hold lisinopril the day of low blood pressure.  He also take his loop diuretic as needed for peripheral edema improved with weight loss Stable on serial images we will recheck in 2 years New pattern on EKG Will need yearly EKGs to look for significant conduction system disease   Next appointment: 1 year   Medication Adjustments/Labs and Tests Ordered: Current medicines are reviewed at length with the patient today.  Concerns regarding medicines are outlined above.  Orders Placed This Encounter  Procedures   EKG 12-Lead   No orders of the defined types were placed in this encounter. Chief complaint follow-up enlarged thoracic aorta History of Present Illness:    Frederick Carter. is a 70 y.o. male with a hx of hypertension mild enlargement ascending aorta 39 mm and prostate cancer with active surveillance last seen 04/05/2020. With shortness of breath he underwent echocardiogram 12/17/2018 showing EF 60 to 65% normal left and right ventricular function normal left ventricular filling pressure and no evidence of pulmonary artery hypertension.  Compliance with diet, lifestyle and medications: Yes  He has purposely lost greater than 40 pounds and feels much better At times his feet are cool but he has no claudication has less edema and does not always take a loop diuretic with weight loss And frequently systolic blood pressures less  than 100 No chest pain shortness of breath palpitation or syncope  He had noncontrast CT of the chest performed 04/27/2020 showing coronary artery and aortic atherosclerosis and a 5.2 mm right lower lobe nodule.  Maximum diameter ascending aorta 38 mm.  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   furosemide (LASIX) 40 MG tablet Take 1 tablet (40 mg total) by mouth daily.  lisinopril-hydrochlorothiazide (ZESTORETIC) 20-25 MG tablet Take 1 tablet by mouth daily.   metoprolol succinate (TOPROL XL) 50 MG 24 hr tablet Take 1 tablet (50 mg total) by mouth daily. Take with or immediately following a meal.   Misc Natural Products (URINOZINC PLUS PO) Take 2 tablets by mouth daily.    multivitamin-lutein (OCUVITE-LUTEIN) CAPS capsule Take 1 capsule by mouth daily.   tamsulosin (FLOMAX) 0.4 MG CAPS capsule Take 0.4 mg by mouth at bedtime.      Allergies:   Patient has no known allergies.   Social History    Socioeconomic History   Marital status: Single    Spouse name: Not on file   Number of children: Not on file   Years of education: Not on file   Highest education level: Not on file  Occupational History   Not on file  Tobacco Use   Smoking status: Never   Smokeless tobacco: Never  Vaping Use   Vaping Use: Never used  Substance and Sexual Activity   Alcohol use: Yes    Comment: occasional   Drug use: No   Sexual activity: Not on file  Other Topics Concern   Not on file  Social History Narrative   Not on file   Social Determinants of Health   Financial Resource Strain: Not on file  Food Insecurity: Not on file  Transportation Needs: Not on file  Physical Activity: Not on file  Stress: Not on file  Social Connections: Not on file     Family History: The patient's family history includes Arthritis in his mother; Cancer in his brother, brother, and sister; Cancer (age of onset: 46) in his paternal uncle. ROS:   Please see the history of present illness.    All other systems reviewed and are negative.  EKGs/Labs/Other Studies Reviewed:    The following studies were reviewed today:  EKG:  EKG ordered today and personally reviewed.  The ekg ordered today demonstrates sinus rhythm right bundle branch block    Physical Exam:    VS:  BP 110/74   Pulse 60   Ht 5\' 6"  (1.676 m)   Wt 229 lb (103.9 kg)   SpO2 97%   BMI 36.96 kg/m     Wt Readings from Last 3 Encounters:  03/31/21 229 lb (103.9 kg)  04/05/20 265 lb (120.2 kg)  10/01/19 265 lb (120.2 kg)     GEN:  Well nourished, well developed in no acute distress HEENT: Normal NECK: No JVD; No carotid bruits LYMPHATICS: No lymphadenopathy CARDIAC: RRR, no murmurs, rubs, gallops RESPIRATORY:  Clear to auscultation without rales, wheezing or rhonchi  ABDOMEN: Soft, non-tender, non-distended MUSCULOSKELETAL:  No edema; No deformity  SKIN: Warm and dry NEUROLOGIC:  Alert and oriented x 3 PSYCHIATRIC:  Normal  affect    Signed, Shirlee More, MD  03/31/2021 2:32 PM    [facility]

## 2021-04-18 DIAGNOSIS — Z20828 Contact with and (suspected) exposure to other viral communicable diseases: Secondary | ICD-10-CM | POA: Diagnosis not present

## 2021-04-18 DIAGNOSIS — R509 Fever, unspecified: Secondary | ICD-10-CM | POA: Diagnosis not present

## 2021-04-18 DIAGNOSIS — R0981 Nasal congestion: Secondary | ICD-10-CM | POA: Diagnosis not present

## 2021-04-18 DIAGNOSIS — J029 Acute pharyngitis, unspecified: Secondary | ICD-10-CM | POA: Diagnosis not present

## 2021-05-09 ENCOUNTER — Other Ambulatory Visit: Payer: Self-pay | Admitting: Cardiology

## 2021-05-23 ENCOUNTER — Telehealth: Payer: Self-pay | Admitting: Cardiology

## 2021-05-23 DIAGNOSIS — R0602 Shortness of breath: Secondary | ICD-10-CM

## 2021-05-23 DIAGNOSIS — I10 Essential (primary) hypertension: Secondary | ICD-10-CM

## 2021-05-23 MED ORDER — FUROSEMIDE 40 MG PO TABS: 40.0000 mg | ORAL_TABLET | Freq: Every day | ORAL | 3 refills | Status: DC

## 2021-05-23 MED ORDER — METOPROLOL SUCCINATE ER 50 MG PO TB24: 50.0000 mg | ORAL_TABLET | Freq: Every day | ORAL | 3 refills | Status: DC

## 2021-05-23 NOTE — Telephone Encounter (Signed)
°*  STAT* If patient is at the pharmacy, call can be transferred to refill team.   1. Which medications need to be refilled? (please list name of each medication and dose if known)  furosemide (LASIX) 40 MG tablet metoprolol succinate (TOPROL XL) 50 MG 24 hr tablet  2. Which pharmacy/location (including street and city if local pharmacy) is medication to be sent to? [place], [place] - [address]  3. Do they need a 30 day or 90 day supply? 90 with refills   Patient's pharmacy changed due to his insurance changing

## 2021-06-17 ENCOUNTER — Other Ambulatory Visit: Payer: Self-pay | Admitting: Cardiology

## 2021-11-12 IMAGING — CT CT CHEST W/O CM
2 of 3 series · 15 of 36 positions shown, 18 images · non-contrast
Comparison: None.

CLINICAL DATA: Follow-up of thoracic aortic aneurysm.

EXAM:
CT CHEST WITHOUT CONTRAST
TECHNIQUE: Multidetector CT imaging of the chest was performed following the
standard protocol without IV contrast.

[Series 2: thorax · axial · 0.75mm/px · z∈[-328,-68]mm · 12 of 154 slices shown, 15 images]
[im 12/154  mediastinal]
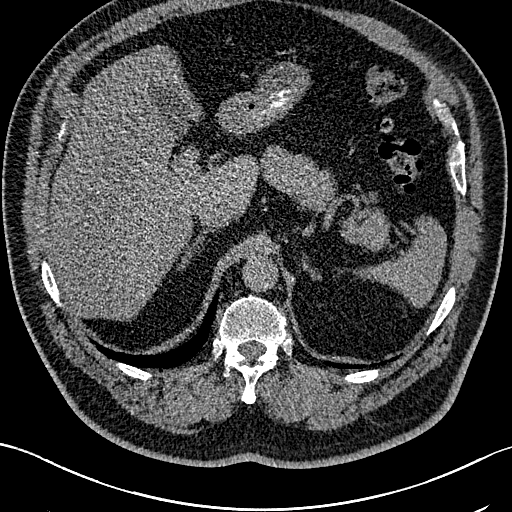
[im 12/154  lung]
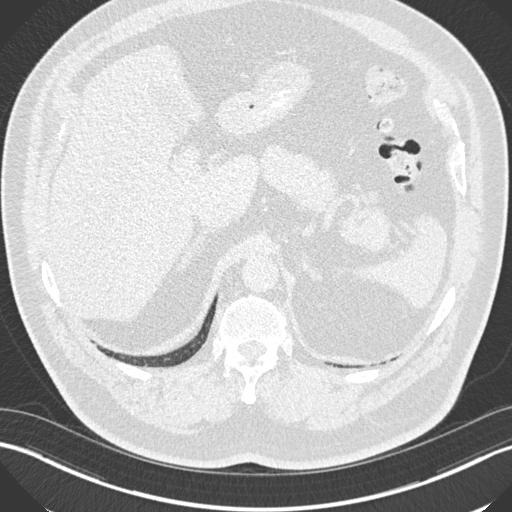
[im 23/154  lung]
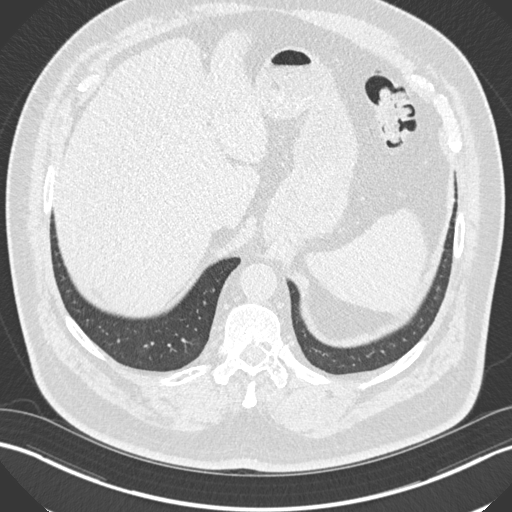
[im 35/154  lung]
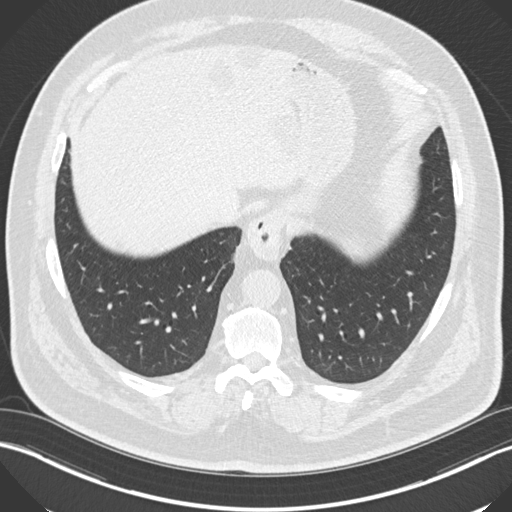
[im 46/154  lung]
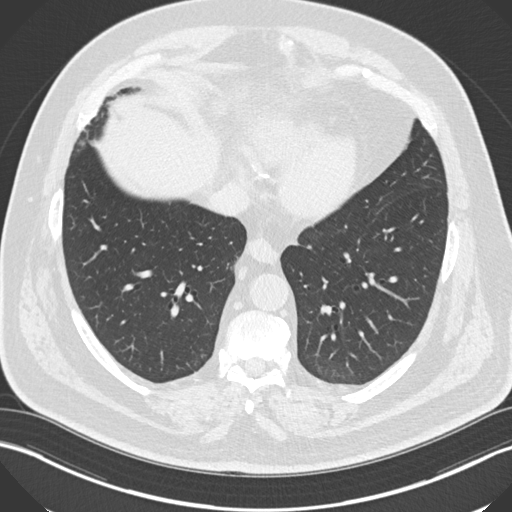
[im 57/154  mediastinal]
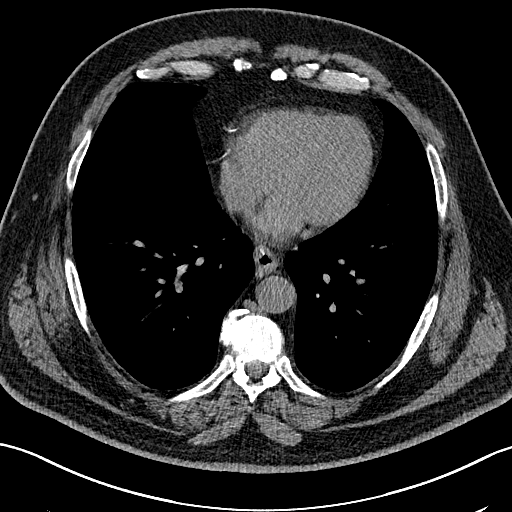
[im 57/154  lung]
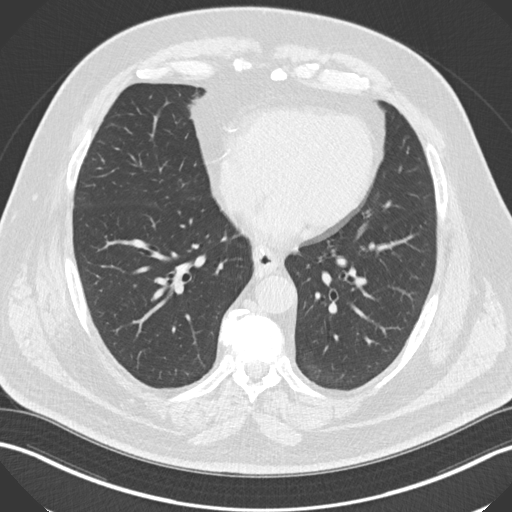
[im 69/154  lung]
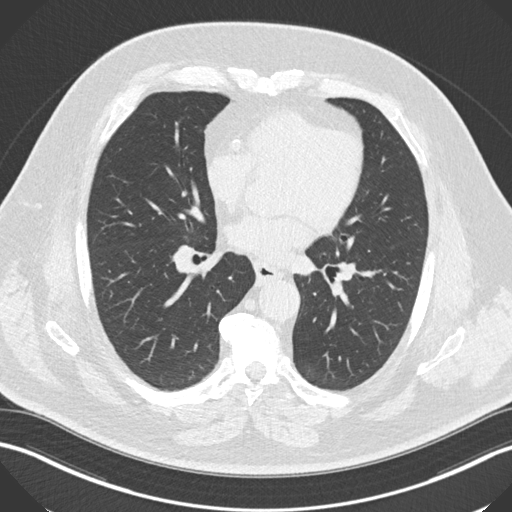
[im 86/154  lung]
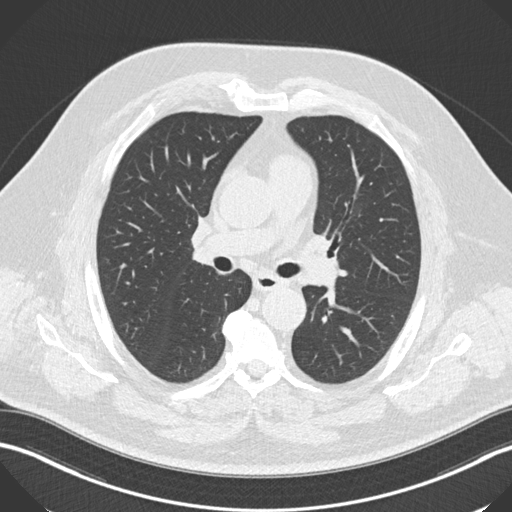
[im 97/154  lung]
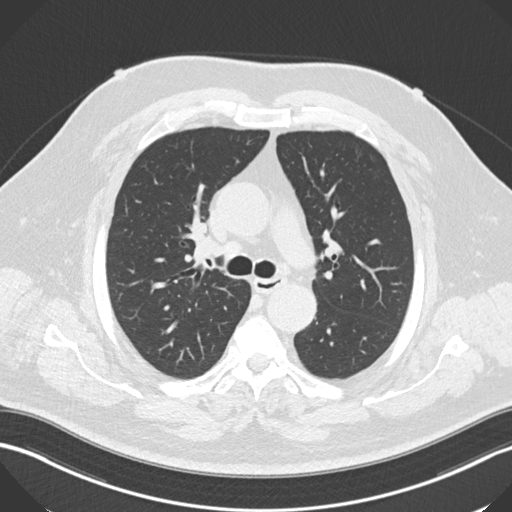
[im 108/154  mediastinal]
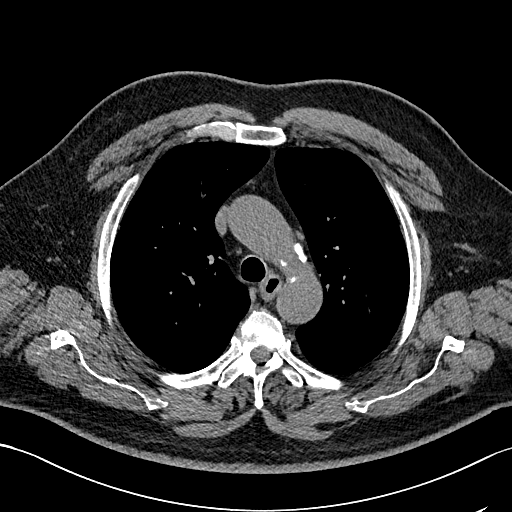
[im 108/154  lung]
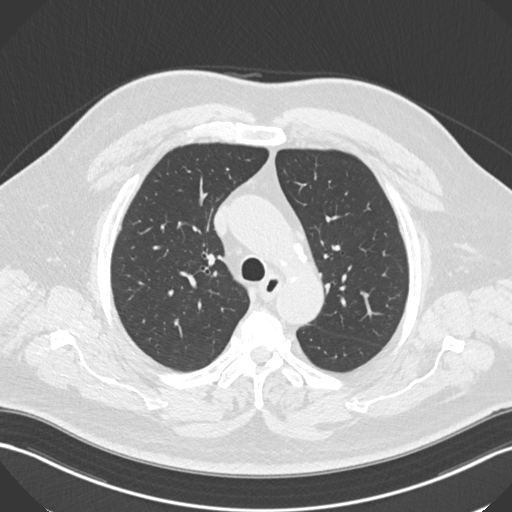
[im 120/154  lung]
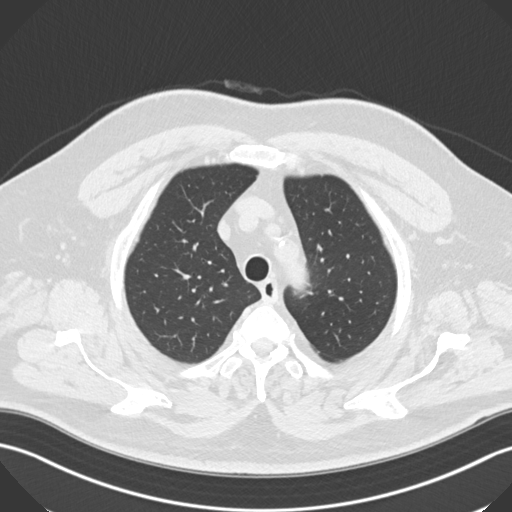
[im 131/154  lung]
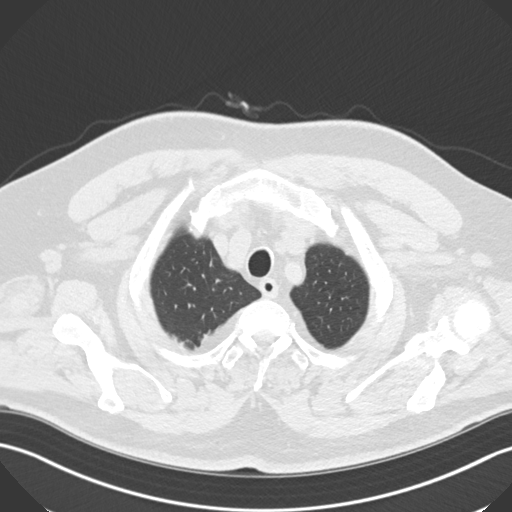
[im 142/154  lung]
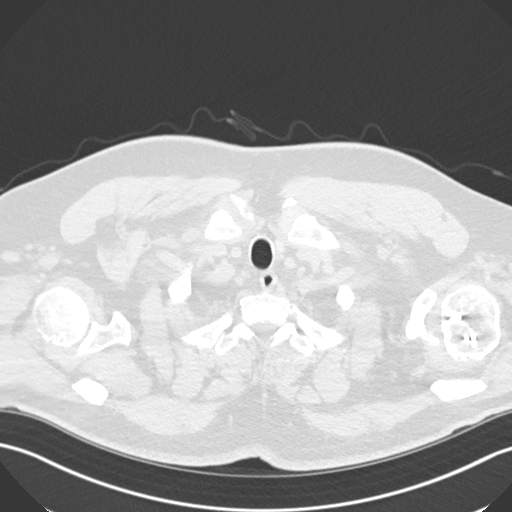

[Series 5: coronal · coronal · 0.61mm/px · 3 of 172 slices shown]
[im 35/172  lung]
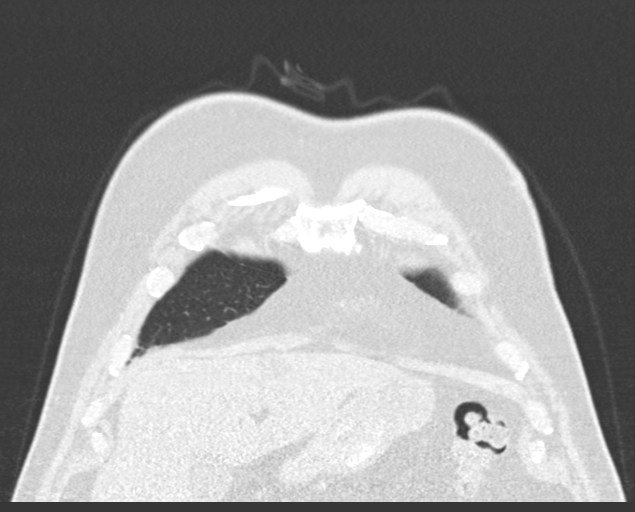
[im 69/172  lung]
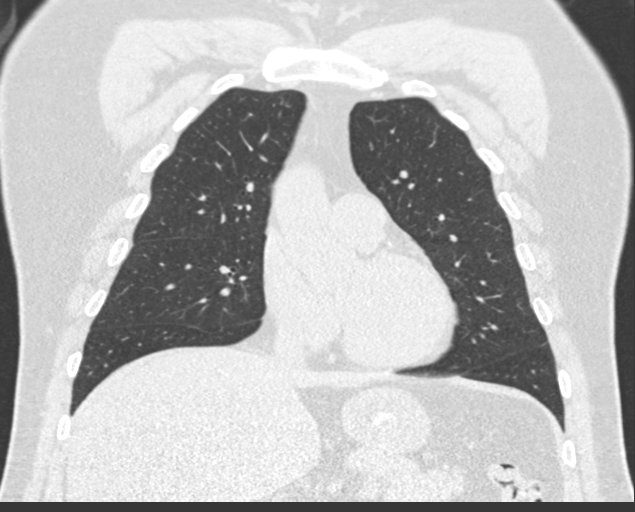
[im 103/172  lung]
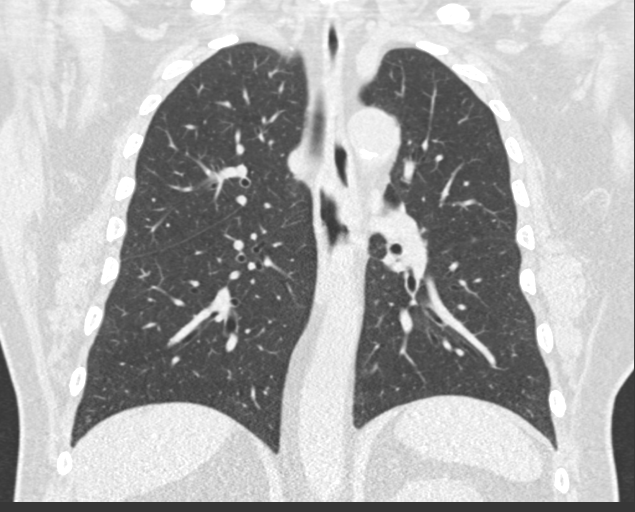

[15 of 36 positions shown; findings below may reference images not displayed]

FINDINGS: Cardiovascular: Aortic and branch vessel atherosclerosis. Normal
heart size, without pericardial effusion. Multivessel coronary
artery atherosclerosis. No evidence of thoracic aortic aneurysm.
Ascending aorta measures on the order of maximally 3.6 cm on
transverse image 67/2. 3.8 cm maximally on coronal image 75. Normal
caliber of transverse and descending segments.

Mediastinum/Nodes: 1.0 cm left thyroid hypoattenuating nodule on
[DATE]. Not clinically significant; no follow-up imaging recommended
(ref: [HOSPITAL]. [DATE]): 143-50).No mediastinal or
definite hilar adenopathy, given limitations of unenhanced CT. Tiny
hiatal hernia. Distal esophageal wall thickening, including on
103/2.

Lungs/Pleura: No pleural fluid. 2 mm right lower lobe pulmonary
nodule on 119/4.

Upper Abdomen: Hepatic cysts. Suspect a tiny gallstone. Normal
imaged portions of the spleen, pancreas, adrenal glands, kidneys.

Musculoskeletal: Surgical changes in the proximal left humerus.
Midthoracic spondylosis.
IMPRESSION: 1. No evidence of thoracic aortic aneurysm.
2.  No acute process in the chest.
3. Coronary artery atherosclerosis. Aortic Atherosclerosis
([id number]).
4. Tiny hiatal hernia. Mild distal esophageal wall thickening,
suspicious for esophagitis.
5. 2 mm right lower lobe pulmonary nodule. No follow-up needed if
patient is low-risk. Non-contrast chest CT can be considered in 12
months if patient is high-risk. This recommendation follows the
consensus statement: Guidelines for Management of Incidental
Pulmonary Nodules Detected on CT Images: From the [HOSPITAL]
6. Probable cholelithiasis.

## 2022-02-06 ENCOUNTER — Other Ambulatory Visit: Payer: Self-pay | Admitting: Cardiology

## 2022-02-06 NOTE — Telephone Encounter (Signed)
Lisinopril - hydrochlorothiazide refills sent to pharmacy

## 2022-03-29 ENCOUNTER — Encounter: Payer: Self-pay | Admitting: Cardiology

## 2022-03-29 ENCOUNTER — Ambulatory Visit: Payer: Medicare HMO | Attending: Cardiology | Admitting: Cardiology

## 2022-03-29 VITALS — BP 102/60 | HR 65 | Ht 66.0 in | Wt 238.0 lb

## 2022-03-29 DIAGNOSIS — I10 Essential (primary) hypertension: Secondary | ICD-10-CM | POA: Diagnosis not present

## 2022-03-29 DIAGNOSIS — I7789 Other specified disorders of arteries and arterioles: Secondary | ICD-10-CM | POA: Diagnosis not present

## 2022-03-29 DIAGNOSIS — E7800 Pure hypercholesterolemia, unspecified: Secondary | ICD-10-CM

## 2022-03-29 DIAGNOSIS — I452 Bifascicular block: Secondary | ICD-10-CM | POA: Diagnosis not present

## 2022-03-29 DIAGNOSIS — Z789 Other specified health status: Secondary | ICD-10-CM

## 2022-03-29 MED ORDER — METOPROLOL SUCCINATE ER 25 MG PO TB24: 25.0000 mg | ORAL_TABLET | Freq: Every day | ORAL | 3 refills | Status: DC

## 2022-03-29 NOTE — Patient Instructions (Signed)
Medication Instructions:  Your physician has recommended you make the following change in your medication:   START: Metoprolol succinate 25 mg daily  *If you need a refill on your cardiac medications before your next appointment, please call your pharmacy*   Lab Work: None If you have labs (blood work) drawn today and your tests are completely normal, you will receive your results only by: [place] (if you have MyChart) OR A paper copy in the mail If you have any lab test that is abnormal or we need to change your treatment, we will call you to review the results.   Testing/Procedures: None   Follow-Up: At [facility], you and your health needs are our priority.  As part of our continuing mission to provide you with exceptional heart care, we have created designated Provider Care Teams.  These Care Teams include your primary Cardiologist (physician) and Advanced Practice Providers (APPs -  Physician Assistants and Nurse Practitioners) who all work together to provide you with the care you need, when you need it.  We recommend signing up for the patient portal called "MyChart".  Sign up information is provided on this After Visit Summary.  MyChart is used to connect with patients for Virtual Visits (Telemedicine).  Patients are able to view lab/test results, encounter notes, upcoming appointments, etc.  Non-urgent messages can be sent to your provider as well.   To learn more about what you can do with MyChart, go to [email or web address].    Your next appointment:   1 year(s)  The format for your next appointment:   In Person  Provider:   Shirlee More, MD    Other Instructions None  Important Information About Sugar

## 2022-03-29 NOTE — Progress Notes (Signed)
Cardiology Office Note:    Date:  03/29/2022   ID:  Frederick Blew., DOB 1950-07-02, MRN [id number]  PCP:  Dineen Kid, MD  Cardiologist:  Shirlee More, MD    Referring MD: No ref. provider found    ASSESSMENT:    1. Essential hypertension   2. RBBB (right bundle branch block with left anterior fascicular block)   3. 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 Hypercholesteremia   5. Statin intolerance    PLAN:    In order of problems listed above:  Very well-controlled BP is at target and with his resting bradycardia will reduce the dose of beta-blocker 50% and continue his lisinopril HCTZ and furosemide He has new left anterior hemiblock no evidence of bradycardia he will need yearly yearly EKGs in the office for conduction system disease Aorta is top normal I think we can easily wait a year to consider repeat We will not except lipid-lowering therapy   Next appointment: 1 year   Medication Adjustments/Labs and Tests Ordered: Current medicines are reviewed at length with the patient today.  Concerns regarding medicines are outlined above.  No orders of the defined types were placed in this encounter.  No orders of the defined types were placed in this encounter.   Chief Complaint  Patient presents with   Follow-up   Enlarged thoracic aorta   Hypertension   Hyperlipidemia    History of Present Illness:    Frederick Kauk. is a 71 y.o. male with a hx of hypertension right bundle branch block and mild enlargement ascending aorta last seen 03/31/2021.  Compliance with diet, lifestyle and medications: Yes  Iona Beard is very active man vigorously exercises gets his heart rate up into the mid 130s at the gym and has had no cardiovascular symptoms of edema shortness of breath chest pain palpitation or syncope He had a bad experience with Lipitor and adamantly refuses to consider any statin or nonstatin lipid-lowering therapy. Home blood pressure is well-controlled less than  120/80 Not symptomatic his resting heart rate is 50 to 60 bpm will reduce his beta-blocker dosage I reviewed the results of his lipid profile CT strongly encouraged lipid-lowering therapy and he is adamant that he will not accept because of the concern of injury  CT of the chest January 2022 showed top normal size ascending aorta 38 mm and coronary and thoracic aortic atherosclerosis  Recent labs 12/27/2021 [place]: Triglyceride 114 cholesterol 234 LDL 167 non-HDL cholesterol 180 HDL 54.  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   furosemide (LASIX) 40 MG tablet Take 1 tablet (40 mg total) by mouth daily.   lisinopril-hydrochlorothiazide (ZESTORETIC) 20-25 MG tablet Take 1 tablet by mouth once daily   Misc Natural Products (URINOZINC PLUS PO) Take 2 tablets by mouth daily.    multivitamin-lutein (OCUVITE-LUTEIN) CAPS capsule Take 1 capsule by mouth daily.   tamsulosin (FLOMAX) 0.4 MG CAPS capsule Take  0.4 mg by mouth at bedtime.    [DISCONTINUED] metoprolol succinate (TOPROL XL) 50 MG 24 hr tablet Take 1 tablet (50 mg total) by mouth daily. Take with or immediately following a meal.     Allergies:   Patient has no known allergies.   Social History   Socioeconomic History   Marital status: Single    Spouse name: Not on file   Number of children: Not on file   Years of education: Not on file   Highest education level: Not on file  Occupational History   Not on file  Tobacco Use   Smoking status: Never   Smokeless tobacco: Never  Vaping Use   Vaping Use: Never used  Substance and Sexual Activity   Alcohol use: Yes    Comment: occasional   Drug use: No   Sexual activity: Not on file  Other Topics Concern   Not on file  Social History Narrative   Not on file   Social Determinants of Health   Financial Resource Strain: Not on file  Food Insecurity: Not on file  Transportation Needs: Not on file  Physical Activity: Not on file  Stress: Not on file  Social Connections: Not on file     Family History: The patient's family history includes Arthritis in his mother; Cancer in his brother, brother, and sister; Cancer (age of onset: 61) in his paternal uncle. ROS:   Please see the history of present illness.    All other systems reviewed and are negative.  EKGs/Labs/Other Studies Reviewed:    The following studies were reviewed today:  He underwent echocardiogram 12/17/2018 showing EF 60 to 65% normal left and right ventricular function normal left ventricular filling pressure and no evidence of pulmonary artery hypertension.   He had noncontrast CT of the chest performed 04/27/2020 showing coronary artery and aortic atherosclerosis and a 5.2 mm right lower lobe nodule. Maximum diameter ascending aorta 38 mm.  EKG:  EKG ordered today and personally reviewed.  The ekg ordered today demonstrates sinus rhythm right bundle branch block new left anterior hemiblock   Physical  Exam:    VS:  BP 102/60 (BP Location: Right Arm, Patient Position: Sitting, Cuff Size: Normal)   Pulse 65   Ht '5\' 6"'$  (1.676 m)   Wt 238 lb (108 kg)   SpO2 97%   BMI 38.41 kg/m     Wt Readings from Last 3 Encounters:  03/29/22 238 lb (108 kg)  03/31/21 229 lb (103.9 kg)  04/05/20 265 lb (120.2 kg)     GEN:  Well nourished, well developed in no acute distress HEENT: Normal NECK: No JVD; No carotid bruits LYMPHATICS: No lymphadenopathy CARDIAC: RRR, no murmurs, rubs, gallops RESPIRATORY:  Clear to auscultation without rales, wheezing or rhonchi  ABDOMEN: Soft, non-tender, non-distended MUSCULOSKELETAL:  No edema; No deformity  SKIN: Warm and dry NEUROLOGIC:  Alert and oriented x 3 PSYCHIATRIC:  Normal affect    Signed, Shirlee More, MD  03/29/2022 4:36 PM    [facility]

## 2022-06-02 ENCOUNTER — Other Ambulatory Visit: Payer: Self-pay | Admitting: Cardiology

## 2022-06-03 ENCOUNTER — Other Ambulatory Visit: Payer: Self-pay | Admitting: Cardiology

## 2022-06-03 DIAGNOSIS — I10 Essential (primary) hypertension: Secondary | ICD-10-CM

## 2022-06-03 DIAGNOSIS — R0602 Shortness of breath: Secondary | ICD-10-CM

## 2022-06-05 NOTE — Telephone Encounter (Signed)
Rx refill sent to pharmacy. 

## 2022-06-08 ENCOUNTER — Ambulatory Visit: Payer: Medicare HMO | Admitting: Gastroenterology

## 2022-06-08 ENCOUNTER — Encounter: Payer: Self-pay | Admitting: Gastroenterology

## 2022-06-08 VITALS — BP 128/70 | HR 68 | Ht 66.0 in | Wt 234.1 lb

## 2022-06-08 DIAGNOSIS — K5909 Other constipation: Secondary | ICD-10-CM | POA: Diagnosis not present

## 2022-06-08 DIAGNOSIS — Z86010 Personal history of colonic polyps: Secondary | ICD-10-CM | POA: Diagnosis not present

## 2022-06-08 NOTE — Patient Instructions (Signed)
We have given you sample of Clenpiq for your Colonoscopy.  You have been scheduled for a colonoscopy. Please follow written instructions given to you at your visit today.  Please pick up your prep supplies at the pharmacy within the next 1-3 days. If you use inhalers (even only as needed), please bring them with you on the day of your procedure.   _______________________________________________________  If your blood pressure at your visit was 140/90 or greater, please contact your primary care physician to follow up on this.  _______________________________________________________  If you are age 82 or older, your body mass index should be between 23-30. Your Body mass index is 37.79 kg/m. If this is out of the aforementioned range listed, please consider follow up with your Primary Care Provider.   __________________________________________________________  The [place] [facility] providers would like to encourage you to use [facility] to communicate with providers for non-urgent requests or questions.  Due to long hold times on the telephone, sending your provider a message by [facility] may be a faster and more efficient way to get a response.  Please allow 48 business hours for a response.  Please remember that this is for non-urgent requests.   Due to recent changes in healthcare laws, you may see the results of your imaging and laboratory studies on MyChart before your provider has had a chance to review them.  We understand that in some cases there may be results that are confusing or concerning to you. Not all laboratory results come back in the same time frame and the provider may be waiting for multiple results in order to interpret others.  Please give [place] 48 hours in order for your provider to thoroughly review all the results before contacting the office for clarification of your results.    It was a pleasure to see you today!  Jackquline Denmark, M.D.

## 2022-06-08 NOTE — Progress Notes (Signed)
Chief Complaint: For colonoscopy  Referring Provider:  Dineen Kid, MD      ASSESSMENT AND PLAN;   #1. H/O polyps  #2. Chronic constipation- much better with MiraLAX  Plan: -Colon with clenpiq samples -Continue MiraLAX on as-needed basis.   Discussed risks & benefits of colonoscopy. Risks including rare perforation req laparotomy, bleeding after bx/polypectomy req blood transfusion, rarely missing neoplasms, risks of anesthesia/sedation, rare risk of damage to internal organs. Benefits outweigh the risks. Patient agrees to proceed. All the questions were answered. Pt consents to proceed. HPI:    Frederick Panno. is a 72 y.o. male  HTN, RBBB, HLD, Enlarged thoracic aorta without aneurysm 03/31/2021, OA, prostate ca  Change in bowel habits in form of increasing constipation-resolved after taking MiraLAX x 1 month. He is due for colonoscopy due to history of polyps. No CBC, CMP 9/52023  Physically very active.  He exercises 3 times a week.  Plays golf  Lost wt intentionally-feels much better overall.  No heartburn.  Denies having any nausea, vomiting, odynophagia, dysphagia, fever chills or night sweats.  No jaundice dark urine or pale stools.  No abdominal pain.   Wt Readings from Last 3 Encounters:  06/08/22 234 lb 2 oz (106.2 kg)  03/29/22 238 lb (108 kg)  03/31/21 229 lb (103.9 kg)    Past GI related workup:  CT Chest 04/2020 1. No evidence of thoracic aortic aneurysm. 2.  No acute process in the chest. 3. Coronary artery atherosclerosis. Aortic Atherosclerosis (ICD10-I70.0). 4. Tiny hiatal hernia. Mild distal esophageal wall thickening, suspicious for esophagitis. 5. 2 mm right lower lobe pulmonary nodule. No follow-up needed if patient is low-risk. Non-contrast chest CT can be considered in 12 months if patient is high-risk. This recommendation follows the consensus statement: Guidelines for Management of Incidental Pulmonary Nodules Detected on CT Images:  From the Fleischner Society 2017; Radiology 2017; 284:228-243. 6. Probable cholelithiasis.  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 Pancolonic diverticulosis predominantly in the sigmoid colon. Internal hemorrhoids-likely etiology of bright red blood per rectum   KNEE ARTHROSCOPY Left 2017   PROSTATE BIOPSY     SHOULDER SURGERY Left    TOTAL KNEE ARTHROPLASTY Left 01/13/2019   Procedure: TOTAL KNEE ARTHROPLASTY;  Surgeon: Vickey Huger, MD;  Location: [facility];  Service: Orthopedics;  Laterality: Left;   TRANSURETHRAL RESECTION OF PROSTATE  2014    Family History  Problem Relation Age of Onset   Arthritis Mother    Cancer Sister        Brain tumor   Colon polyps Sister    Cancer Brother        Prostate   Cancer Brother  Lung and Brain   Cancer Paternal Uncle 85       Prostate   Colon cancer Neg Hx     Social History   Tobacco Use   Smoking status: Never   Smokeless tobacco: Never  Vaping Use   Vaping Use: Never used  Substance Use Topics   Alcohol use: Yes    Comment: occasional   Drug use: No    Current Outpatient Medications  Medication Sig Dispense Refill   APPLE CIDER  VINEGAR PO Take 450 mg by mouth daily.     ASPIRIN 81 PO Take 81 mg by mouth daily.     brimonidine (ALPHAGAN) 0.2 % ophthalmic solution Place 1 drop into the right eye 2 (two) times daily.     furosemide (LASIX) 40 MG tablet Take 1 tablet (40 mg total) by mouth daily. 90 tablet 3   lisinopril-hydrochlorothiazide (ZESTORETIC) 20-25 MG tablet Take 1 tablet by mouth once daily 180 tablet 0   metoprolol succinate (TOPROL XL) 25 MG 24 hr tablet Take 1 tablet (25 mg total) by mouth daily. 90 tablet 3   Misc Natural Products (URINOZINC PLUS PO) Take 2 tablets by mouth daily.      multivitamin-lutein (OCUVITE-LUTEIN) CAPS capsule Take 1 capsule by mouth daily.     Omega-3 Fatty Acids (FISH OIL) 1000 MG CAPS Take 2,000 mg by mouth daily.     tamsulosin (FLOMAX) 0.4 MG CAPS capsule Take 0.4 mg by mouth at bedtime.      timolol (TIMOPTIC) 0.5 % ophthalmic solution Place 1 drop into the right eye daily.     No current facility-administered medications for this visit.    No Known Allergies  Review of Systems:  Constitutional: Denies fever, chills, diaphoresis, appetite change and fatigue.  HEENT: Denies photophobia, eye pain, redness, hearing loss, ear pain, congestion, sore throat, rhinorrhea, sneezing, mouth sores, neck pain, neck stiffness and tinnitus.   Respiratory: Denies SOB, DOE, cough, chest tightness,  and wheezing.   Cardiovascular: Denies chest pain, palpitations and leg swelling.  Genitourinary: Denies dysuria, urgency, frequency, hematuria, flank pain and difficulty urinating.  Musculoskeletal: Denies myalgias, back pain, joint swelling, arthralgias and gait problem.  Has osteoarthritis. Skin: No rash.  Neurological: Denies dizziness, seizures, syncope, weakness, light-headedness, numbness and headaches.  Hematological: Denies adenopathy. Easy bruising, personal or family bleeding history  Psychiatric/Behavioral: No anxiety or depression     Physical Exam:    BP 128/70   Pulse 68    Ht 5' 6"$  (1.676 m)   Wt 234 lb 2 oz (106.2 kg)   SpO2 98%   BMI 37.79 kg/m  Wt Readings from Last 3 Encounters:  06/08/22 234 lb 2 oz (106.2 kg)  03/29/22 238 lb (108 kg)  03/31/21 229 lb (103.9 kg)   Constitutional:  Well-developed, in no acute distress. Psychiatric: Normal mood and affect. Behavior is normal. HEENT: Pupils normal.  Conjunctivae are normal. No scleral icterus. Cardiovascular: Normal rate, regular rhythm. No edema Pulmonary/chest: Effort normal and breath sounds normal. No wheezing, rales or rhonchi. Abdominal: Soft, nondistended. Nontender. Bowel sounds active throughout. There are no masses palpable. No hepatomegaly. Rectal: Deferred Neurological: Alert and oriented to person place and time. Skin: Skin is warm and dry. No rashes noted.    Carmell Austria, MD 06/08/2022, 11:12 AM  Cc: Dineen Kid, MD

## 2022-06-12 ENCOUNTER — Telehealth: Payer: Self-pay | Admitting: Gastroenterology

## 2022-06-12 NOTE — Telephone Encounter (Signed)
Patient is calling wondering if he would be able to do an endoscopy at the same time as his colonoscopy. Please advise

## 2022-06-14 NOTE — Telephone Encounter (Signed)
Not having any upper GI symptoms when I saw him in office His CT did show small hiatal hernia If he is having reflux symptoms, he should start omeprazole 20 mg p.o. daily Hold off on EGD as he is not having any upper GI symptoms For now, just proceed with colonoscopy, as per last office note RG

## 2022-07-25 ENCOUNTER — Encounter: Payer: Self-pay | Admitting: Gastroenterology

## 2022-08-02 ENCOUNTER — Ambulatory Visit (AMBULATORY_SURGERY_CENTER): Payer: Medicare HMO | Admitting: Gastroenterology

## 2022-08-02 ENCOUNTER — Encounter: Payer: Self-pay | Admitting: Gastroenterology

## 2022-08-02 VITALS — BP 110/77 | HR 64 | Temp 98.6°F | Resp 12 | Ht 66.0 in | Wt 234.0 lb

## 2022-08-02 DIAGNOSIS — Z09 Encounter for follow-up examination after completed treatment for conditions other than malignant neoplasm: Secondary | ICD-10-CM | POA: Diagnosis not present

## 2022-08-02 DIAGNOSIS — D123 Benign neoplasm of transverse colon: Secondary | ICD-10-CM

## 2022-08-02 DIAGNOSIS — D124 Benign neoplasm of descending colon: Secondary | ICD-10-CM

## 2022-08-02 DIAGNOSIS — D128 Benign neoplasm of rectum: Secondary | ICD-10-CM

## 2022-08-02 DIAGNOSIS — Z86010 Personal history of colonic polyps: Secondary | ICD-10-CM | POA: Diagnosis not present

## 2022-08-02 DIAGNOSIS — K6389 Other specified diseases of intestine: Secondary | ICD-10-CM | POA: Diagnosis not present

## 2022-08-02 MED ORDER — SODIUM CHLORIDE 0.9 % IV SOLN: 500.0000 mL | Freq: Once | INTRAVENOUS | Status: DC

## 2022-08-02 NOTE — Progress Notes (Signed)
Chief Complaint: For colonoscopy  Referring Provider:  Selina CooleyWitten, Bobby, MD      ASSESSMENT AND PLAN;   #1. H/O polyps  #2. Chronic constipation- much better with MiraLAX  Plan: -Colon with clenpiq samples -Continue MiraLAX on as-needed basis.   Discussed risks & benefits of colonoscopy. Risks including rare perforation req laparotomy, bleeding after bx/polypectomy req blood transfusion, rarely missing neoplasms, risks of anesthesia/sedation, rare risk of damage to internal organs. Benefits outweigh the risks. Patient agrees to proceed. All the questions were answered. Pt consents to proceed. HPI:    Frederick DawnGeorgie Middlekauff Jr. is a 72 y.o. male  HTN, RBBB, HLD, Enlarged thoracic aorta without aneurysm 03/31/2021, OA, prostate ca  Change in bowel habits in form of increasing constipation-resolved after taking MiraLAX x 1 month. He is due for colonoscopy due to history of polyps. No CBC, CMP 9/52023  Physically very active.  He exercises 3 times a week.  Plays golf  Lost wt intentionally-feels much better overall.  No heartburn.  Denies having any nausea, vomiting, odynophagia, dysphagia, fever chills or night sweats.  No jaundice dark urine or pale stools.  No abdominal pain.   Wt Readings from Last 3 Encounters:  08/02/22 234 lb (106.1 kg)  06/08/22 234 lb 2 oz (106.2 kg)  03/29/22 238 lb (108 kg)    Past GI related workup:  CT Chest 04/2020 1. No evidence of thoracic aortic aneurysm. 2.  No acute process in the chest. 3. Coronary artery atherosclerosis. Aortic Atherosclerosis (ICD10-I70.0). 4. Tiny hiatal hernia. Mild distal esophageal wall thickening, suspicious for esophagitis. 5. 2 mm right lower lobe pulmonary nodule. No follow-up needed if patient is low-risk. Non-contrast chest CT can be considered in 12 months if patient is high-risk. This recommendation follows the consensus statement: Guidelines for Management of Incidental Pulmonary Nodules Detected on CT Images:  From the Fleischner Society 2017; Radiology 2017; 284:228-243. 6. Probable cholelithiasis.  Colonoscopy Jan 2015 ([facility]) -Colonic polyps s/p polypectomy -Pancolonic diverticulosis predominantly in the sigmoid -Internal hemorrhoids  SH- golf Past Medical History:  Diagnosis Date   Arthritis    Cataract    Diverticulosis    Dyspnea 04/2018   Enlarged thoracic aorta 05/06/2018   Essential hypertension 04/08/2018   Genetic testing 01/11/2017   Cancelled appointment and declined r/s.   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 Pancolonic diverticulosis predominantly in the sigmoid colon. Internal hemorrhoids-likely etiology of bright red blood per rectum   KNEE ARTHROSCOPY Left 2017   PROSTATE BIOPSY     SHOULDER SURGERY Left    TOTAL KNEE ARTHROPLASTY Left 01/13/2019   Procedure: TOTAL KNEE ARTHROPLASTY;  Surgeon: Dannielle HuhLucey, Steve, MD;  Location: [facility];  Service: Orthopedics;  Laterality: Left;   TRANSURETHRAL RESECTION OF PROSTATE  2014    Family History  Problem Relation Age of Onset   Arthritis Mother    Cancer Sister        Brain tumor   Colon polyps Sister    Cancer Brother        Prostate   Cancer Brother  Lung and Brain   Cancer Paternal Uncle 4       Prostate   Colon cancer Neg Hx    Rectal cancer Neg Hx    Stomach cancer Neg Hx    Esophageal cancer Neg Hx     Social History   Tobacco Use   Smoking status: Never   Smokeless tobacco: Never  Vaping Use   Vaping Use: Never used  Substance Use Topics   Alcohol use: Yes    Comment: occasional   Drug use: No    Current Outpatient  Medications  Medication Sig Dispense Refill   APPLE CIDER VINEGAR PO Take 450 mg by mouth daily.     ASPIRIN 81 PO Take 81 mg by mouth daily.     brimonidine (ALPHAGAN) 0.2 % ophthalmic solution Place 1 drop into the right eye 2 (two) times daily.     furosemide (LASIX) 40 MG tablet Take 1 tablet (40 mg total) by mouth daily. 90 tablet 3   lisinopril-hydrochlorothiazide (ZESTORETIC) 20-25 MG tablet Take 1 tablet by mouth once daily 180 tablet 0   metoprolol succinate (TOPROL XL) 25 MG 24 hr tablet Take 1 tablet (25 mg total) by mouth daily. 90 tablet 3   Misc Natural Products (URINOZINC PLUS PO) Take 2 tablets by mouth daily.      multivitamin-lutein (OCUVITE-LUTEIN) CAPS capsule Take 1 capsule by mouth daily.     Omega-3 Fatty Acids (FISH OIL) 1000 MG CAPS Take 2,000 mg by mouth daily.     timolol (TIMOPTIC) 0.5 % ophthalmic solution Place 1 drop into the right eye daily.     tamsulosin (FLOMAX) 0.4 MG CAPS capsule Take 0.4 mg by mouth at bedtime.  (Patient not taking: Reported on 08/02/2022)     Current Facility-Administered Medications  Medication Dose Route Frequency Provider Last Rate Last Admin   0.9 %  sodium chloride infusion  500 mL Intravenous Once Lynann Bologna, MD        No Known Allergies  Review of Systems:  Constitutional: Denies fever, chills, diaphoresis, appetite change and fatigue.  HEENT: Denies photophobia, eye pain, redness, hearing loss, ear pain, congestion, sore throat, rhinorrhea, sneezing, mouth sores, neck pain, neck stiffness and tinnitus.   Respiratory: Denies SOB, DOE, cough, chest tightness,  and wheezing.   Cardiovascular: Denies chest pain, palpitations and leg swelling.  Genitourinary: Denies dysuria, urgency, frequency, hematuria, flank pain and difficulty urinating.  Musculoskeletal: Denies myalgias, back pain, joint swelling, arthralgias and gait problem.  Has osteoarthritis. Skin: No rash.  Neurological: Denies dizziness, seizures, syncope, weakness,  light-headedness, numbness and headaches.  Hematological: Denies adenopathy. Easy bruising, personal or family bleeding history  Psychiatric/Behavioral: No anxiety or depression     Physical Exam:    BP 120/71   Pulse 68   Temp 98.6 F (37 C)   Ht  (1.676 m)   Wt 234 lb (106.1 kg)   SpO2 94%   BMI 37.77 kg/m  Wt Readings from Last 3 Encounters:  08/02/22 234 lb (106.1 kg)  06/08/22 234 lb 2 oz (106.2 kg)  03/29/22 238 lb (108 kg)   Constitutional:  Well-developed, in no acute distress. Psychiatric: Normal mood and affect. Behavior is normal. HEENT: Pupils normal.  Conjunctivae are normal. No scleral icterus. Cardiovascular: Normal rate, regular rhythm. No edema Pulmonary/chest: Effort normal and breath sounds normal. No wheezing, rales or rhonchi. Abdominal: Soft, nondistended. Nontender. Bowel sounds active throughout. There are no masses palpable. No hepatomegaly. Rectal: Deferred Neurological: Alert and oriented  to person place and time. Skin: Skin is warm and dry. No rashes noted.    Edman Circle, MD 08/02/2022, 11:31 AM  Cc: Selina Cooley, MD

## 2022-08-02 NOTE — Op Note (Signed)
[facility] Patient Name: Frederick Carter Procedure Date: 08/02/2022 11:35 AM MRN: [id number] Endoscopist: Lynann Bologna , MD, [id number] Age: 72 Referring MD:  Date of Birth: 22-Mar-1951 Gender: Male Account #: [id number] Procedure:                Colonoscopy Indications:              High risk colon cancer surveillance: Personal                            history of colonic polyps Medicines:                Monitored Anesthesia Care Procedure:                Pre-Anesthesia Assessment:                           - Prior to the procedure, a History and Physical                            was performed, and patient medications and                            allergies were reviewed. The patient's tolerance of                            previous anesthesia was also reviewed. The risks                            and benefits of the procedure and the sedation                            options and risks were discussed with the patient.                            All questions were answered, and informed consent                            was obtained. Prior Anticoagulants: The patient has                            taken no anticoagulant or antiplatelet agents. ASA                            Grade Assessment: II - A patient with mild systemic                            disease. After reviewing the risks and benefits,                            the patient was deemed in satisfactory condition to                            undergo the procedure.  After obtaining informed consent, the colonoscope                            was passed under direct vision. Throughout the                            procedure, the patient's blood pressure, pulse, and                            oxygen saturations were monitored continuously. The                            Olympus CF-HQ190L ([id number]) Colonoscope was                            introduced through the anus and advanced to  the the                            cecum, identified by appendiceal orifice and                            ileocecal valve. The colonoscopy was performed                            without difficulty. The patient tolerated the                            procedure well. The quality of the bowel                            preparation was good. The ileocecal valve,                            appendiceal orifice, and rectum were photographed. Scope In: 11:37:45 AM Scope Out: 11:53:09 AM Scope Withdrawal Time: 0 hours 12 minutes 55 seconds  Total Procedure Duration: 0 hours 15 minutes 24 seconds  Findings:                 Two sessile polyps were found in the proximal                            transverse colon. The polyps were 2 to 6 mm in                            size. These polyps were removed with a cold snare.                            Resection and retrieval were complete.                           Three sessile polyps were found in the rectum and                            proximal descending colon. The polyps  were 2 to 6                            mm in size. The smaller polyp was removed by cold                            biopsy forceps and others with a cold snare.                            Resection and retrieval were complete.                           Multiple medium-mouthed diverticula were found in                            the sigmoid colon, few in descending colon and                            ascending colon. Few diverticula in the sigmoid                            colon with stool impacted.                           Non-bleeding internal hemorrhoids were found during                            retroflexion. The hemorrhoids were moderate and                            Grade I (internal hemorrhoids that do not prolapse).                           The exam was otherwise without abnormality on                            direct and retroflexion views. Complications:             No immediate complications. Estimated Blood Loss:     Estimated blood loss: none. Impression:               - Two 2 to 6 mm polyps in the proximal transverse                            colon, removed with a cold snare. Resected and                            retrieved.                           - Three 2 to 6 mm polyps in the rectum and in the                            proximal descending colon, removed with a cold  snare. Resected and retrieved.                           - Moderate predominantly sigmoid diverticulosis.                           - Non-bleeding internal hemorrhoids.                           - The examination was otherwise normal on direct                            and retroflexion views. Recommendation:           - Patient has a contact number available for                            emergencies. The signs and symptoms of potential                            delayed complications were discussed with the                            patient. Return to normal activities tomorrow.                            Written discharge instructions were provided to the                            patient.                           - Resume previous high-fiber diet.                           - Continue present medications.                           - Await pathology results.                           - Brochures regarding diverticulosis.                           - Repeat colonoscopy for surveillance based on                            pathology results. Likely not needed due to                            advanced age.                           - The findings and recommendations were discussed                            with the patient's family. Lynann Bologna, MD 08/02/2022 11:59:02 AM This report has  been signed electronically.

## 2022-08-02 NOTE — Patient Instructions (Signed)
Please read handouts provided. Continue present medications. Await pathology results. High Fiber Diet.   YOU HAD AN ENDOSCOPIC PROCEDURE TODAY AT THE [facility]:   Refer to the procedure report that was given to you for any specific questions about what was found during the examination.  If the procedure report does not answer your questions, please call your gastroenterologist to clarify.  If you requested that your care partner not be given the details of your procedure findings, then the procedure report has been included in a sealed envelope for you to review at your convenience later.  YOU SHOULD EXPECT: Some feelings of bloating in the abdomen. Passage of more gas than usual.  Walking can help get rid of the air that was put into your GI tract during the procedure and reduce the bloating. If you had a lower endoscopy (such as a colonoscopy or flexible sigmoidoscopy) you may notice spotting of blood in your stool or on the toilet paper. If you underwent a bowel prep for your procedure, you may not have a normal bowel movement for a few days.  Please Note:  You might notice some irritation and congestion in your nose or some drainage.  This is from the oxygen used during your procedure.  There is no need for concern and it should clear up in a day or so.  SYMPTOMS TO REPORT IMMEDIATELY:  Following lower endoscopy (colonoscopy or flexible sigmoidoscopy):  Excessive amounts of blood in the stool  Significant tenderness or worsening of abdominal pains  Swelling of the abdomen that is new, acute  Fever of 100F or higher.  For urgent or emergent issues, a gastroenterologist can be reached at any hour by calling [phone]. Do not use MyChart messaging for urgent concerns.    DIET:  We do recommend a small meal at first, but then you may proceed to your regular diet.  Drink plenty of fluids but you should avoid alcoholic beverages for 24 hours.  ACTIVITY:  You should plan  to take it easy for the rest of today and you should NOT DRIVE or use heavy machinery until tomorrow (because of the sedation medicines used during the test).    FOLLOW UP: Our staff will call the number listed on your records the next business day following your procedure.  We will call around 7:15- 8:00 am to check on you and address any questions or concerns that you may have regarding the information given to you following your procedure. If we do not reach you, we will leave a message.     If any biopsies were taken you will be contacted by phone or by letter within the next 1-3 weeks.  Please call us at [phone] if you have not heard about the biopsies in 3 weeks.    SIGNATURES/CONFIDENTIALITY: You and/or your care partner have signed paperwork which will be entered into your electronic medical record.  These signatures attest to the fact that that the information above on your After Visit Summary has been reviewed and is understood.  Full responsibility of the confidentiality of this discharge information lies with you and/or your care-partner.  

## 2022-08-02 NOTE — Progress Notes (Signed)
Called to room to assist during endoscopic procedure.  Patient ID and intended procedure confirmed with present staff. Received instructions for my participation in the procedure from the performing physician.  

## 2022-08-02 NOTE — Progress Notes (Signed)
Vss nad trans to pacu 

## 2022-08-03 ENCOUNTER — Telehealth: Payer: Self-pay

## 2022-08-03 NOTE — Telephone Encounter (Signed)
  Follow up Call-     08/02/2022   10:48 AM  Call back number  Post procedure Call Back phone  # [phone]  Permission to leave phone message Yes     Patient questions:  Do you have a fever, pain , or abdominal swelling? No. Pain Score  0 *  Have you tolerated food without any problems? Yes.    Have you been able to return to your normal activities? Yes.    Do you have any questions about your discharge instructions: Diet   No. Medications  No. Follow up visit  No.  Do you have questions or concerns about your Care? No.  Actions: * If pain score is 4 or above: No action needed, pain <4.

## 2022-08-08 ENCOUNTER — Other Ambulatory Visit: Payer: Self-pay | Admitting: Cardiology

## 2022-08-10 ENCOUNTER — Encounter: Payer: Self-pay | Admitting: Gastroenterology

## 2023-01-09 DIAGNOSIS — R7303 Prediabetes: Secondary | ICD-10-CM

## 2023-01-09 HISTORY — DX: Prediabetes: R73.03

## 2023-01-31 ENCOUNTER — Other Ambulatory Visit: Payer: Self-pay | Admitting: Cardiology

## 2023-03-10 ENCOUNTER — Other Ambulatory Visit: Payer: Self-pay | Admitting: Cardiology

## 2023-03-23 ENCOUNTER — Other Ambulatory Visit: Payer: Self-pay | Admitting: Cardiology

## 2023-04-09 ENCOUNTER — Other Ambulatory Visit: Payer: Self-pay | Admitting: Cardiology

## 2023-05-07 ENCOUNTER — Other Ambulatory Visit: Payer: Self-pay | Admitting: Cardiology

## 2023-05-07 ENCOUNTER — Encounter: Payer: Self-pay | Admitting: Cardiology

## 2023-05-07 DIAGNOSIS — K5790 Diverticulosis of intestine, part unspecified, without perforation or abscess without bleeding: Secondary | ICD-10-CM | POA: Insufficient documentation

## 2023-05-08 ENCOUNTER — Ambulatory Visit: Payer: Medicare Other | Attending: Cardiology | Admitting: Cardiology

## 2023-05-08 ENCOUNTER — Encounter: Payer: Self-pay | Admitting: Cardiology

## 2023-05-08 VITALS — BP 116/78 | HR 74 | Ht 66.0 in | Wt 261.2 lb

## 2023-05-08 DIAGNOSIS — E7800 Pure hypercholesterolemia, unspecified: Secondary | ICD-10-CM

## 2023-05-08 DIAGNOSIS — I2510 Atherosclerotic heart disease of native coronary artery without angina pectoris: Secondary | ICD-10-CM | POA: Diagnosis not present

## 2023-05-08 DIAGNOSIS — I10 Essential (primary) hypertension: Secondary | ICD-10-CM

## 2023-05-08 DIAGNOSIS — I452 Bifascicular block: Secondary | ICD-10-CM

## 2023-05-08 DIAGNOSIS — K5790 Diverticulosis of intestine, part unspecified, without perforation or abscess without bleeding: Secondary | ICD-10-CM

## 2023-05-08 MED ORDER — METOPROLOL SUCCINATE ER 25 MG PO TB24: 25.0000 mg | ORAL_TABLET | Freq: Every day | ORAL | 2 refills | Status: DC

## 2023-05-08 MED ORDER — PRAVASTATIN SODIUM 20 MG PO TABS: 20.0000 mg | ORAL_TABLET | ORAL | 3 refills | Status: DC

## 2023-05-08 NOTE — Progress Notes (Signed)
 Cardiology Office Note:    Date:  05/08/2023   ID:  Frederick Lyle Raddle., DOB 03/01/1951, MRN [id number]  PCP:  No primary care provider on file.  Cardiologist:  Redell Leiter, MD    Referring MD: Xenia Devonshire, MD    ASSESSMENT:    1. Essential hypertension   2. Coronary artery calcification seen on CT scan   3. RBBB (right bundle branch block with left anterior fascicular block)   4. Hypercholesteremia   5. Primary hypertension    PLAN:    In order of problems listed above:  Stable BP at target continue current ARB and thiazide with normal potassium He agrees to initiate a minimum dose of the statin with his atherosclerosis and hyperlipidemia follow-up labs in 2 months if tolerated I will try to negotiate taking daily Stable EKG pattern I do not see a clinical indication to repeat cardiac imaging stress and echo at this time   Next appointment: 1 year   Medication Adjustments/Labs and Tests Ordered: Current medicines are reviewed at length with the patient today.  Concerns regarding medicines are outlined above.  Orders Placed This Encounter  Procedures   EKG 12-Lead   Meds ordered this encounter  Medications   metoprolol  succinate (TOPROL -XL) 25 MG 24 hr tablet    Sig: Take 1 tablet (25 mg total) by mouth daily.    Dispense:  90 tablet    Refill:  2     History of Present Illness:    Frederick Petersen. is a 73 y.o. male with a hx of  hypertension right bundle branch block and mild enlargement ascending aorta l last seen 03/29/2022.CT of the chest January 2022 showed top normal size ascending aorta 38 mm and coronary and thoracic aortic atherosclerosis.  Recent labs 4 months ago: Total 217 LDL 137 non-HDL cholesterol quite elevated 173  Compliance with diet, lifestyle and medications: Yes  He told my office staff he is short of breath intolerance but tells me the symptoms are minimal and he attributed to inactivity he is going to join the gym again and being  sedentary with weight gain He is not having edema orthopnea chest pain palpitation or syncope Spent quite a while discussing lipid-lowering therapy the benefits and the potential injury from long-term untreated hyperlipidemia especially in a man with evidence of coronary and aortic atherosclerosis He had a friend who had a long-term injury from lipid-lowering therapy with a atorvastatin We negotiated that he take a low-dose of low intensity statin 3 days a week Past Medical History:  Diagnosis Date   Arthritis    Cataract    Chronic pain of left knee 12/11/2018   Diverticulosis    Dyspnea 04/2018   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   ASPIRIN  81 PO Take 81 mg by mouth daily.   brimonidine (ALPHAGAN) 0.2 % ophthalmic solution Place 1 drop into the right eye  2 (two) times daily.   furosemide  (LASIX ) 40 MG tablet Take 1 tablet (40 mg total) by mouth daily.   lisinopril -hydrochlorothiazide  (ZESTORETIC ) 20-25 MG tablet Take 1 tablet by mouth daily. Patient must keep appt for further refills   Misc Natural Products (URINOZINC PLUS PO) Take 2 tablets by mouth daily.    multivitamin-lutein (OCUVITE-LUTEIN) CAPS capsule Take 1 capsule by mouth daily.   Omega-3 Fatty Acids (FISH OIL) 1000 MG CAPS Take 2,000 mg by mouth daily.   timolol (TIMOPTIC) 0.5 % ophthalmic solution Place 1 drop into the right eye daily.   [DISCONTINUED] metoprolol   succinate (TOPROL -XL) 25 MG 24 hr tablet Take 1 tablet by mouth once daily      EKGs/Labs/Other Studies Reviewed:    The following studies were reviewed today:  Cardiac Studies & Procedures     STRESS TESTS  MYOCARDIAL PERFUSION IMAGING 04/10/2018  Narrative  Nuclear stress EF: 65%. No wall motion abnormalities  There was no ST segment deviation noted during stress.  The study is normal.  This is a low risk study. No ischemia identified.  Frederick Parchment, MD  ECHOCARDIOGRAM  ECHOCARDIOGRAM COMPLETE 12/17/2018  Narrative ECHOCARDIOGRAM REPORT    Patient Name:   Frederick Tappan. Date of Exam: 12/17/2018 Medical Rec #:  [id number]          Height:       67.0 in Accession #:    [id number]         Weight:       264.0 lb Date of Birth:  21-May-1950           BSA:          2.28 m Patient Age:    68 years           BP:           122/82 mmHg Patient Gender: M                  HR:           65 bpm. Exam Location:  [place]   Procedure: 2D Echo  Indications:    Essential HTN  History:        Patient has prior history of Echocardiogram examinations, most recent 04/10/2018. Signs/Symptoms: Shortness of Breath Risk Factors: Hypertension.  Sonographer:    Fairy Canton RDCS (AE) Referring Phys: [id number] Hilary Milks J Leshia Kope  IMPRESSIONS   1. The left ventricle has normal systolic function with an ejection fraction of 60-65%. The cavity size was normal. Left ventricular diastolic Doppler parameters are consistent with impaired relaxation. 2. The right ventricle has normal systolic function. The cavity was normal. There is no increase in right ventricular wall thickness. 3. The aorta is abnormal unless otherwise noted. 4. There is mild dilatation of the ascending aorta measuring 39 mm.  FINDINGS Left Ventricle: The left ventricle has normal systolic function, with an ejection fraction of 60-65%. The cavity size was normal. There is no increase in left ventricular wall thickness.  Left ventricular diastolic Doppler parameters are consistent with impaired relaxation.  Right Ventricle: The right ventricle has normal systolic function. The cavity was normal. There is no increase in right ventricular wall thickness.  Left Atrium: Left atrial size was normal in size.  Right Atrium: Right atrial size was normal in size. Right atrial pressure is estimated at 3 mmHg.  Interatrial Septum: No atrial level shunt detected by color flow Doppler.  Pericardium: There is no evidence of pericardial effusion.  Mitral Valve:  The mitral valve is normal in structure. Mitral valve regurgitation is mild by color flow Doppler.  Tricuspid Valve: The tricuspid valve is normal in structure. Tricuspid valve regurgitation is trivial by color flow Doppler.  Aortic Valve: The aortic valve is normal in structure. Aortic valve regurgitation was not assessed by color flow Doppler.  Pulmonic Valve: The pulmonic valve was normal in structure. Pulmonic valve regurgitation was not assessed by color flow Doppler.  Aorta: The aorta is abnormal unless otherwise noted. There is mild dilatation of the ascending aorta measuring 39 mm.  Venous: The inferior vena cava measures 1.11 cm, is normal in size with greater than 50% respiratory variability.   +--------------+--------++ LEFT VENTRICLE         +----------------+----------++ +--------------+--------++ Diastology                 PLAX 2D                +----------------+----------++ +--------------+--------++ LV e' lateral:  10.30 cm/s LVIDd:        5.56 cm  +----------------+----------++ +--------------+--------++ LV E/e' lateral:6.8        LVIDs:        3.37 cm  +----------------+----------++ +--------------+--------++ LV e' medial:   7.62 cm/s  LV PW:        1.24 cm  +----------------+----------++ +--------------+--------++ LV E/e' medial: 9.2        LV IVS:       1.07 cm   +----------------+----------++ +--------------+--------++ LVOT diam:    1.80 cm  +--------------+--------++ LV SV:        105 ml   +--------------+--------++ LV SV Index:  42.95    +--------------+--------++ LVOT Area:    2.54 cm +--------------+--------++                        +--------------+--------++  +---------------+----------++ RIGHT VENTRICLE           +---------------+----------++ RV Basal diam: 4.51 cm    +---------------+----------++ RV S prime:    17.80 cm/s +---------------+----------++ TAPSE (M-mode):2.3 cm     +---------------+----------++  +---------------+-------++-----------++ LEFT ATRIUM           Index       +---------------+-------++-----------++ LA diam:       3.20 cm1.41 cm/m  +---------------+-------++-----------++ LA Vol (A2C):  57.6 ml25.31 ml/m +---------------+-------++-----------++ LA Vol (A4C):  45.6 ml20.04 ml/m +---------------+-------++-----------++ LA Biplane Vol:54.6 ml23.99 ml/m +---------------+-------++-----------++ +------------+---------++-----------++ RIGHT ATRIUM         Index       +------------+---------++-----------++ RA Area:    15.50 cm            +------------+---------++-----------++ RA Volume:  40.80 ml 17.93 ml/m +------------+---------++-----------++ +------------+-----------++ AORTIC VALVE            +------------+-----------++ LVOT Vmax:  90.80 cm/s  +------------+-----------++ LVOT Vmean: 54.500 cm/s +------------+-----------++ LVOT VTI:   0.191 m     +------------+-----------++  +-------------+-------++ AORTA                +-------------+-------++ Ao Root diam:3.60 cm +-------------+-------++ Ao Asc diam: 3.80 cm +-------------+-------++  +--------------+----------++ +---------------+-----------++ MITRAL VALVE             TRICUSPID VALVE             +--------------+----------++ +---------------+-----------++ MV Area (PHT):2.93 cm   TR Peak grad:  15.1 mmHg   +--------------+----------++ +---------------+-----------++ MV PHT:       75.11 msec TR Vmax:       194.00 cm/s +--------------+----------++ +---------------+-----------++  MV Decel Time:259 msec   +--------------+----------++ +--------------+-------+ +--------------+----------++ SHUNTS                MV E velocity:70.00 cm/s +--------------+-------+ +--------------+----------++ Systemic VTI: 0.19 m  MV A velocity:84.80 cm/s +--------------+-------+ +--------------+----------++ Systemic Diam:1.80 cm MV E/A ratio: 0.83       +--------------+-------+ +--------------+----------++  +---------+-------+ IVC              +---------+-------+ IVC diam:1.11 cm +---------+-------+   Lamar Fitch MD Electronically signed by Lamar Fitch MD Signature Date/Time: 12/17/2018/12:51:06 PM    Final             EKG Interpretation Date/Time:  Tuesday May 08 2023 15:05:42 EST Ventricular Rate:  74 PR Interval:  158 QRS Duration:  134 QT Interval:  404 QTC Calculation: 448 R Axis:   -54  Text Interpretation: Normal sinus rhythm Right bundle branch block Left anterior fascicular block Bifascicular block No previous ECGs available Confirmed by Monetta Rogue ([id number]) on 05/08/2023 3:11:23 PM   Recent Labs: 01/02/2023 hemoglobin 14.9 platelets 259,000 CMP with a sodium 141 potassium 4.1 creatinine 1.3 normal liver function test Cholesterol 217 LDL 137 Physical Exam:    VS:  BP 116/78   Pulse 74   Ht 5' 6 (1.676 m)   Wt 261 lb 3.2 oz (118.5 kg)   SpO2 94%   BMI 42.16 kg/m     Wt Readings from Last 3 Encounters:  05/08/23 261 lb 3.2 oz (118.5 kg)  08/02/22 234 lb (106.1 kg)  06/08/22 234 lb 2 oz (106.2 kg)     GEN: Obese BMI exceeds 40 well nourished, well developed in no acute distress HEENT: Normal NECK: No  JVD; No carotid bruits LYMPHATICS: No lymphadenopathy CARDIAC: RRR, no murmurs, rubs, gallops RESPIRATORY:  Clear to auscultation without rales, wheezing or rhonchi  ABDOMEN: Soft, non-tender, non-distended MUSCULOSKELETAL:  No edema; No deformity  SKIN: Warm and dry NEUROLOGIC:  Alert and oriented x 3 PSYCHIATRIC:  Normal affect    Signed, Rogue Monetta, MD  05/08/2023 3:24 PM    [facility]

## 2023-05-08 NOTE — Patient Instructions (Signed)
 Medication Instructions:  Your physician has recommended you make the following change in your medication:   START: Pravastatin  20 mg (Take with evening meal on M-W-F)  *If you need a refill on your cardiac medications before your next appointment, please call your pharmacy*   Lab Work: Your physician recommends that you return for lab work in:   Labs in 2 months: Lipids, CMP  If you have labs (blood work) drawn today and your tests are completely normal, you will receive your results only by: MyChart Message (if you have MyChart) OR A paper copy in the mail If you have any lab test that is abnormal or we need to change your treatment, we will call you to review the results.   Testing/Procedures: None   Follow-Up: At [facility], you and your health needs are our priority.  As part of our continuing mission to provide you with exceptional heart care, we have created designated Provider Care Teams.  These Care Teams include your primary Cardiologist (physician) and Advanced Practice Providers (APPs -  Physician Assistants and Nurse Practitioners) who all work together to provide you with the care you need, when you need it.  We recommend signing up for the patient portal called MyChart.  Sign up information is provided on this After Visit Summary.  MyChart is used to connect with patients for Virtual Visits (Telemedicine).  Patients are able to view lab/test results, encounter notes, upcoming appointments, etc.  Non-urgent messages can be sent to your provider as well.   To learn more about what you can do with MyChart, go to [email or web address].    Your next appointment:   1 year(s)  Provider:   Redell Leiter, MD    Other Instructions None

## 2023-05-18 ENCOUNTER — Other Ambulatory Visit: Payer: Self-pay | Admitting: Cardiology

## 2023-05-18 DIAGNOSIS — R0602 Shortness of breath: Secondary | ICD-10-CM

## 2023-05-18 DIAGNOSIS — I10 Essential (primary) hypertension: Secondary | ICD-10-CM

## 2023-05-28 ENCOUNTER — Other Ambulatory Visit: Payer: Self-pay | Admitting: Cardiology

## 2023-05-28 NOTE — Telephone Encounter (Signed)
 Rx refill sent to pharmacy.

## 2023-05-30 DIAGNOSIS — N3289 Other specified disorders of bladder: Secondary | ICD-10-CM | POA: Diagnosis not present

## 2023-05-30 DIAGNOSIS — H401330 Pigmentary glaucoma, bilateral, stage unspecified: Secondary | ICD-10-CM | POA: Diagnosis not present

## 2023-05-30 DIAGNOSIS — C61 Malignant neoplasm of prostate: Secondary | ICD-10-CM | POA: Diagnosis not present

## 2023-05-30 DIAGNOSIS — H40021 Open angle with borderline findings, high risk, right eye: Secondary | ICD-10-CM | POA: Diagnosis not present

## 2023-05-30 DIAGNOSIS — H40022 Open angle with borderline findings, high risk, left eye: Secondary | ICD-10-CM | POA: Diagnosis not present

## 2023-09-20 DIAGNOSIS — R0981 Nasal congestion: Secondary | ICD-10-CM | POA: Diagnosis not present

## 2023-09-20 DIAGNOSIS — J0100 Acute maxillary sinusitis, unspecified: Secondary | ICD-10-CM | POA: Diagnosis not present

## 2023-09-20 DIAGNOSIS — R070 Pain in throat: Secondary | ICD-10-CM | POA: Diagnosis not present

## 2023-09-26 DIAGNOSIS — R0981 Nasal congestion: Secondary | ICD-10-CM | POA: Diagnosis not present

## 2023-09-26 DIAGNOSIS — R059 Cough, unspecified: Secondary | ICD-10-CM | POA: Diagnosis not present

## 2023-11-20 DIAGNOSIS — H40021 Open angle with borderline findings, high risk, right eye: Secondary | ICD-10-CM | POA: Diagnosis not present

## 2023-11-20 DIAGNOSIS — H40022 Open angle with borderline findings, high risk, left eye: Secondary | ICD-10-CM | POA: Diagnosis not present

## 2023-11-22 DIAGNOSIS — I10 Essential (primary) hypertension: Secondary | ICD-10-CM | POA: Diagnosis not present

## 2023-11-22 DIAGNOSIS — H409 Unspecified glaucoma: Secondary | ICD-10-CM | POA: Diagnosis not present

## 2023-11-22 DIAGNOSIS — C61 Malignant neoplasm of prostate: Secondary | ICD-10-CM | POA: Diagnosis not present

## 2023-11-22 DIAGNOSIS — K5909 Other constipation: Secondary | ICD-10-CM | POA: Diagnosis not present

## 2023-11-26 DIAGNOSIS — N3289 Other specified disorders of bladder: Secondary | ICD-10-CM | POA: Diagnosis not present

## 2023-11-26 DIAGNOSIS — C61 Malignant neoplasm of prostate: Secondary | ICD-10-CM | POA: Diagnosis not present

## 2023-12-06 ENCOUNTER — Other Ambulatory Visit: Payer: Self-pay | Admitting: Cardiology

## 2023-12-10 ENCOUNTER — Telehealth: Payer: Self-pay | Admitting: Cardiology

## 2023-12-10 NOTE — Telephone Encounter (Signed)
*  STAT* If patient is at the pharmacy, call can be transferred to refill team.   1. Which medications need to be refilled? (please list name of each medication and dose if known)  metoprolol  succinate (TOPROL -XL) 25 MG 24 hr tablet   2. Which pharmacy/location (including street and city if local pharmacy) is medication to be sent to?[facility] - [place], [place] - [address]   3. Do they need a 30 day or 90 day supply?  90 day supply

## 2023-12-11 MED ORDER — METOPROLOL SUCCINATE ER 25 MG PO TB24: 25.0000 mg | ORAL_TABLET | Freq: Every day | ORAL | 1 refills | Status: AC

## 2023-12-11 NOTE — Telephone Encounter (Signed)
 RX sent in

## 2023-12-20 DIAGNOSIS — M25571 Pain in right ankle and joints of right foot: Secondary | ICD-10-CM | POA: Diagnosis not present

## 2024-01-11 DIAGNOSIS — H409 Unspecified glaucoma: Secondary | ICD-10-CM | POA: Diagnosis not present

## 2024-01-11 DIAGNOSIS — Z0000 Encounter for general adult medical examination without abnormal findings: Secondary | ICD-10-CM | POA: Diagnosis not present

## 2024-01-11 DIAGNOSIS — I10 Essential (primary) hypertension: Secondary | ICD-10-CM | POA: Diagnosis not present

## 2024-01-11 DIAGNOSIS — K5909 Other constipation: Secondary | ICD-10-CM | POA: Diagnosis not present

## 2024-01-11 DIAGNOSIS — Z131 Encounter for screening for diabetes mellitus: Secondary | ICD-10-CM | POA: Diagnosis not present

## 2024-01-11 DIAGNOSIS — C61 Malignant neoplasm of prostate: Secondary | ICD-10-CM | POA: Diagnosis not present

## 2024-01-11 DIAGNOSIS — Z79899 Other long term (current) drug therapy: Secondary | ICD-10-CM | POA: Diagnosis not present

## 2024-04-29 DIAGNOSIS — R0602 Shortness of breath: Secondary | ICD-10-CM

## 2024-04-29 DIAGNOSIS — I10 Essential (primary) hypertension: Secondary | ICD-10-CM

## 2024-05-10 NOTE — Progress Notes (Unsigned)
 " Cardiology Office Note:    Date:  05/12/2024   ID:  Frederick Lyle Raddle., DOB 11/20/1950, MRN [id number]  PCP:  ole Glendia Ellen DNP Cardiologist:  Redell Leiter, MD    Referring MD: No ref. provider found    ASSESSMENT:    1. Essential hypertension   2. Coronary artery calcification seen on CT scan   3. RBBB (right bundle branch block with left anterior fascicular block)   4. Hypercholesteremia    PLAN:    In order of problems listed above:  Stable BP at target continue thiazide diuretic ACE inhibitor beta-blocker and diuretic in his PCP office He declines lipid-lowering treatment Stable EKG pattern without progressive conduction system disease and asymptomatic Poorly controlled he declines lipid-lowering treatment   Next appointment: 1 year follow-up   Medication Adjustments/Labs and Tests Ordered: Current medicines are reviewed at length with the patient today.  Concerns regarding medicines are outlined above.  Orders Placed This Encounter  Procedures   EKG 12-Lead   No orders of the defined types were placed in this encounter.    History of Present Illness:    Frederick Glasheen. is a 74 y.o. male with a hx of hypertension right bundle branch block and LAHB mild enlargement ascending aorta most recently  36 mm with CT 2022 and coronary and thoracic atherosclerosis last seen 05/08/2023.  At that time he agreed to initiate lipid-lowering therapy.  Profile 01/02/2023 cholesterol 217 LDL 137 non-HDL cholesterol 173.  He had a myocardial perfusion study in 2019 low risk EF 65% no ischemia.  His last visit he agreed to initiate low-dose pravastatin  3 days weekly.  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 No wall motion abnormalities  There was no ST segment deviation noted during stress.  The study is normal.  This is a low risk study. No ischemia identified.  Frederick Parchment, MD   ECHOCARDIOGRAM  ECHOCARDIOGRAM COMPLETE 12/17/2018  Narrative ECHOCARDIOGRAM REPORT    Patient Name:   Frederick Ciano. Date of Exam: 12/17/2018 Medical Rec #:  [id number]          Height:       67.0 in Accession #:    [id number]         Weight:  264.0 lb Date of Birth:  1950/05/13           BSA:          2.28 m Patient Age:    68 years           BP:           122/82 mmHg Patient Gender: M                  HR:           65 bpm. Exam Location:  [place]   Procedure: 2D Echo  Indications:    Essential HTN  History:        Patient has prior history of Echocardiogram examinations, most recent 04/10/2018. Signs/Symptoms: Shortness of Breath Risk Factors: Hypertension.  Sonographer:    Fairy Canton RDCS (AE) Referring Phys: [id number] Miranda Frese J Alexie Samson  IMPRESSIONS   1. The left ventricle has normal systolic function with an ejection fraction of 60-65%. The cavity size was normal. Left ventricular diastolic Doppler parameters are consistent with impaired relaxation. 2. The right ventricle has normal systolic function. The cavity was normal. There is no increase in right ventricular wall thickness. 3. The aorta is abnormal unless otherwise noted. 4. There is mild dilatation of the ascending aorta measuring 39 mm.  FINDINGS Left Ventricle: The left ventricle has normal systolic function, with an ejection fraction of 60-65%. The cavity size was normal. There is no increase in left ventricular wall thickness. Left ventricular diastolic Doppler parameters are consistent with impaired relaxation.  Right Ventricle: The right ventricle has normal systolic function. The cavity was normal. There is no increase in right ventricular wall thickness.  Left Atrium: Left atrial size was normal in size.  Right Atrium: Right atrial size was normal in size. Right atrial pressure is estimated at 3 mmHg.  Interatrial Septum: No atrial level shunt detected by color flow Doppler.  Pericardium: There is no evidence of pericardial effusion.  Mitral Valve: The mitral valve is normal in structure. Mitral valve regurgitation is mild by color flow Doppler.  Tricuspid Valve: The tricuspid valve is normal in structure. Tricuspid valve regurgitation is trivial by color flow Doppler.  Aortic Valve: The aortic valve is normal in structure. Aortic valve regurgitation was not assessed by color flow Doppler.  Pulmonic Valve: The pulmonic valve was normal in structure. Pulmonic valve regurgitation was not assessed by color flow Doppler.  Aorta: The aorta is abnormal unless otherwise noted. There is mild dilatation of the ascending aorta measuring 39 mm.  Venous: The inferior vena cava measures 1.11 cm, is normal  in size with greater than 50% respiratory variability.   +--------------+--------++ LEFT VENTRICLE         +----------------+----------++ +--------------+--------++ Diastology                 PLAX 2D                +----------------+----------++ +--------------+--------++ LV e' lateral:  10.30 cm/s LVIDd:        5.56 cm  +----------------+----------++ +--------------+--------++ LV E/e' lateral:6.8        LVIDs:        3.37 cm  +----------------+----------++ +--------------+--------++ LV e' medial:   7.62 cm/s  LV PW:        1.24 cm  +----------------+----------++ +--------------+--------++ LV E/e' medial: 9.2        LV IVS:       1.07 cm  +----------------+----------++ +--------------+--------++ LVOT diam:  1.80 cm  +--------------+--------++ LV SV:        105 ml   +--------------+--------++ LV SV Index:  42.95    +--------------+--------++ LVOT Area:    2.54 cm +--------------+--------++                        +--------------+--------++  +---------------+----------++ RIGHT VENTRICLE           +---------------+----------++ RV Basal diam: 4.51 cm    +---------------+----------++ RV S prime:    17.80 cm/s +---------------+----------++ TAPSE (M-mode):2.3 cm     +---------------+----------++  +---------------+-------++-----------++ LEFT ATRIUM           Index       +---------------+-------++-----------++ LA diam:       3.20 cm1.41 cm/m  +---------------+-------++-----------++ LA Vol (A2C):  57.6 ml25.31 ml/m +---------------+-------++-----------++ LA Vol (A4C):  45.6 ml20.04 ml/m +---------------+-------++-----------++ LA Biplane Vol:54.6 ml23.99 ml/m +---------------+-------++-----------++ +------------+---------++-----------++ RIGHT ATRIUM         Index       +------------+---------++-----------++ RA Area:    15.50 cm             +------------+---------++-----------++ RA Volume:  40.80 ml 17.93 ml/m +------------+---------++-----------++ +------------+-----------++ AORTIC VALVE            +------------+-----------++ LVOT Vmax:  90.80 cm/s  +------------+-----------++ LVOT Vmean: 54.500 cm/s +------------+-----------++ LVOT VTI:   0.191 m     +------------+-----------++  +-------------+-------++ AORTA                +-------------+-------++ Ao Root diam:3.60 cm +-------------+-------++ Ao Asc diam: 3.80 cm +-------------+-------++  +--------------+----------++ +---------------+-----------++ MITRAL VALVE             TRICUSPID VALVE            +--------------+----------++ +---------------+-----------++ MV Area (PHT):2.93 cm   TR Peak grad:  15.1 mmHg   +--------------+----------++ +---------------+-----------++ MV PHT:       75.11 msec TR Vmax:       194.00 cm/s +--------------+----------++ +---------------+-----------++ MV Decel Time:259 msec   +--------------+----------++ +--------------+-------+ +--------------+----------++ SHUNTS                MV E velocity:70.00 cm/s +--------------+-------+ +--------------+----------++ Systemic VTI: 0.19 m  MV A velocity:84.80 cm/s +--------------+-------+ +--------------+----------++ Systemic Diam:1.80 cm MV E/A ratio: 0.83       +--------------+-------+ +--------------+----------++  +---------+-------+ IVC              +---------+-------+ IVC diam:1.11 cm +---------+-------+   Lamar Fitch MD Electronically signed by Lamar Fitch MD Signature Date/Time: 12/17/2018/12:51:06 PM    Final          ______________________________________________________________________________________________      EKG Interpretation Date/Time:  Monday May 12 2024 15:23:27 EST Ventricular Rate:  66 PR Interval:  162 QRS Duration:  140 QT Interval:  412 QTC  Calculation: 431 R Axis:   -51  Text Interpretation: Normal sinus rhythm with sinus arrhythmia Right bundle branch block Left anterior fascicular block Bifascicular block ) When compared with ECG of 08-May-2023 15:05, No significant change since last tracing Confirmed by Monetta Rogue ([id number]) on 05/12/2024 3:28:07 PM   Recent Labs: 01/11/2024 cholesterol 233 LDL 131 hemoglobin 14.9 creatinine 1.10  Physical Exam:    VS:  BP 138/70   Pulse 66   Ht 5' 6.5 (1.689 m)   Wt 251 lb 8 oz (114.1 kg)   SpO2 97%   BMI 39.99 kg/m     Wt Readings from Last 3 Encounters:  05/12/24 251 lb 8 oz (114.1  kg)  05/08/23 261 lb 3.2 oz (118.5 kg)  08/02/22 234 lb (106.1 kg)     GEN:  Well nourished, well developed in no acute distress HEENT: Normal NECK: No JVD; No carotid bruits LYMPHATICS: No lymphadenopathy CARDIAC: RRR, no murmurs, rubs, gallops RESPIRATORY:  Clear to auscultation without rales, wheezing or rhonchi  ABDOMEN: Soft, non-tender, non-distended MUSCULOSKELETAL:  No edema; No deformity  SKIN: Warm and dry NEUROLOGIC:  Alert and oriented x 3 PSYCHIATRIC:  Normal affect    Signed, Redell Leiter, MD  05/12/2024 4:03 PM    [facility]      [1]  Current Meds  Medication Sig   APPLE CIDER VINEGAR PO Take 450 mg by mouth daily.   ASPIRIN  81 PO Take 81 mg by mouth daily.   brimonidine (ALPHAGAN) 0.2 % ophthalmic solution Place 1 drop into the right eye 2 (two) times daily.   furosemide  (LASIX ) 40 MG tablet Take 1 tablet (40 mg total) by mouth daily. Please keep scheduled appt for additional refills.   lisinopril -hydrochlorothiazide  (ZESTORETIC ) 20-25 MG tablet Take 1 tablet by mouth daily.   metoprolol  succinate (TOPROL -XL) 25 MG 24 hr tablet Take 1 tablet (25 mg total) by mouth daily.   Misc Natural Products (URINOZINC PLUS PO) Take 2 tablets by mouth daily.    multivitamin-lutein (OCUVITE-LUTEIN) CAPS capsule Take 1 capsule by mouth daily.   Omega-3 Fatty  Acids (FISH OIL) 1000 MG CAPS Take 2,000 mg by mouth daily.   tamsulosin  (FLOMAX ) 0.4 MG CAPS capsule Take 0.4 mg by mouth every morning.   timolol (TIMOPTIC) 0.5 % ophthalmic solution Place 1 drop into the right eye daily.   "

## 2024-05-12 ENCOUNTER — Encounter: Payer: Self-pay | Admitting: Cardiology

## 2024-05-12 ENCOUNTER — Ambulatory Visit: Attending: Cardiology | Admitting: Cardiology

## 2024-05-12 VITALS — BP 138/70 | HR 66 | Ht 66.5 in | Wt 251.5 lb

## 2024-05-12 DIAGNOSIS — E7800 Pure hypercholesterolemia, unspecified: Secondary | ICD-10-CM

## 2024-05-12 DIAGNOSIS — I10 Essential (primary) hypertension: Secondary | ICD-10-CM | POA: Diagnosis not present

## 2024-05-12 DIAGNOSIS — I2510 Atherosclerotic heart disease of native coronary artery without angina pectoris: Secondary | ICD-10-CM

## 2024-05-12 DIAGNOSIS — I452 Bifascicular block: Secondary | ICD-10-CM

## 2024-05-12 NOTE — Patient Instructions (Addendum)

## 2024-05-16 ENCOUNTER — Other Ambulatory Visit: Payer: Self-pay | Admitting: Cardiology

## 2024-05-19 ENCOUNTER — Other Ambulatory Visit: Payer: Self-pay | Admitting: Cardiology
